# Patient Record
Sex: Female | Born: 1965 | Race: White | Hispanic: No | Smoking: Current some day smoker
Health system: Southern US, Community
[De-identification: ages and names within clinical notes are randomized; demographics above are authoritative.]

## PROBLEM LIST (undated history)

## (undated) DIAGNOSIS — R112 Nausea with vomiting, unspecified: Secondary | ICD-10-CM

## (undated) DIAGNOSIS — I10 Essential (primary) hypertension: Secondary | ICD-10-CM

## (undated) DIAGNOSIS — F419 Anxiety disorder, unspecified: Secondary | ICD-10-CM

## (undated) DIAGNOSIS — Z98891 History of uterine scar from previous surgery: Secondary | ICD-10-CM

## (undated) DIAGNOSIS — T4145XA Adverse effect of unspecified anesthetic, initial encounter: Secondary | ICD-10-CM

## (undated) DIAGNOSIS — M1990 Unspecified osteoarthritis, unspecified site: Secondary | ICD-10-CM

## (undated) DIAGNOSIS — Z9889 Other specified postprocedural states: Secondary | ICD-10-CM

## (undated) DIAGNOSIS — T8859XA Other complications of anesthesia, initial encounter: Secondary | ICD-10-CM

## (undated) HISTORY — PX: ABDOMINAL HYSTERECTOMY: SHX81

## (undated) HISTORY — PX: BREAST EXCISIONAL BIOPSY: SUR124

## (undated) HISTORY — DX: Essential (primary) hypertension: I10

## (undated) HISTORY — PX: TUBAL LIGATION: SHX77

## (undated) HISTORY — PX: CHOLECYSTECTOMY: SHX55

---

## 1998-01-07 ENCOUNTER — Other Ambulatory Visit: Admission: RE | Admit: 1998-01-07 | Discharge: 1998-01-07 | Payer: Self-pay | Admitting: *Deleted

## 2004-05-10 ENCOUNTER — Ambulatory Visit: Payer: Self-pay | Admitting: Orthopedic Surgery

## 2005-07-26 ENCOUNTER — Emergency Department: Payer: Self-pay | Admitting: Emergency Medicine

## 2005-08-03 ENCOUNTER — Ambulatory Visit: Payer: Self-pay | Admitting: Family Medicine

## 2005-08-15 ENCOUNTER — Encounter: Payer: Self-pay | Admitting: Family Medicine

## 2005-09-11 ENCOUNTER — Encounter: Payer: Self-pay | Admitting: Family Medicine

## 2007-03-13 ENCOUNTER — Ambulatory Visit: Payer: Self-pay | Admitting: Family Medicine

## 2009-08-11 ENCOUNTER — Ambulatory Visit: Payer: Self-pay | Admitting: Family Medicine

## 2009-08-14 ENCOUNTER — Ambulatory Visit: Payer: Self-pay | Admitting: Family Medicine

## 2011-09-22 ENCOUNTER — Ambulatory Visit: Payer: Self-pay | Admitting: Family Medicine

## 2011-11-16 ENCOUNTER — Ambulatory Visit: Payer: Self-pay | Admitting: Obstetrics & Gynecology

## 2011-11-16 LAB — CBC
HCT: 39.9 % (ref 35.0–47.0)
HGB: 13.4 g/dL (ref 12.0–16.0)
MCHC: 33.6 g/dL (ref 32.0–36.0)
RDW: 14.9 % — ABNORMAL HIGH (ref 11.5–14.5)
WBC: 8 10*3/uL (ref 3.6–11.0)

## 2011-11-22 ENCOUNTER — Ambulatory Visit: Payer: Self-pay | Admitting: Obstetrics & Gynecology

## 2011-11-22 LAB — PREGNANCY, URINE: Pregnancy Test, Urine: NEGATIVE m[IU]/mL

## 2011-11-23 LAB — PATHOLOGY REPORT

## 2011-11-23 LAB — HEMOGLOBIN: HGB: 11.2 g/dL — ABNORMAL LOW (ref 12.0–16.0)

## 2011-11-30 ENCOUNTER — Ambulatory Visit: Payer: Self-pay | Admitting: Obstetrics & Gynecology

## 2012-10-29 ENCOUNTER — Ambulatory Visit: Payer: Self-pay | Admitting: Surgery

## 2012-10-29 LAB — CBC WITH DIFFERENTIAL/PLATELET
Basophil #: 0 10*3/uL (ref 0.0–0.1)
Basophil %: 0.5 %
Eosinophil #: 0.1 10*3/uL (ref 0.0–0.7)
HCT: 37.9 % (ref 35.0–47.0)
HGB: 13.2 g/dL (ref 12.0–16.0)
Lymphocyte #: 3.2 10*3/uL (ref 1.0–3.6)
MCH: 32.2 pg (ref 26.0–34.0)
Monocyte #: 0.4 x10 3/mm (ref 0.2–0.9)
Monocyte %: 4.3 %
Neutrophil #: 4.7 10*3/uL (ref 1.4–6.5)
RDW: 13.5 % (ref 11.5–14.5)
WBC: 8.4 10*3/uL (ref 3.6–11.0)

## 2012-10-29 LAB — COMPREHENSIVE METABOLIC PANEL
Albumin: 3.8 g/dL (ref 3.4–5.0)
Alkaline Phosphatase: 64 U/L (ref 50–136)
Anion Gap: 5 — ABNORMAL LOW (ref 7–16)
Chloride: 107 mmol/L (ref 98–107)
EGFR (African American): >60
Glucose: 85 mg/dL (ref 65–99)
Osmolality: 275 (ref 275–301)
Potassium: 3.5 mmol/L (ref 3.5–5.1)
SGOT(AST): 18 U/L (ref 15–37)
SGPT (ALT): 16 U/L (ref 12–78)
Total Protein: 6.9 g/dL (ref 6.4–8.2)

## 2012-10-30 ENCOUNTER — Ambulatory Visit: Payer: Self-pay | Admitting: Surgery

## 2012-10-31 LAB — PATHOLOGY REPORT

## 2014-05-23 ENCOUNTER — Ambulatory Visit: Payer: Self-pay | Admitting: Surgery

## 2014-07-01 NOTE — Op Note (Signed)
PATIENT NAME:  Taylor Morris, Taylor Morris MR#:  [id number] DATE OF BIRTH:  10-02-1965  DATE OF PROCEDURE:  11/22/2011  PREOPERATIVE DIAGNOSIS: Menorrhagia, pelvic pain, and dyspareunia.   POSTOPERATIVE DIAGNOSIS: Menorrhagia, pelvic pain, and dyspareunia.   PROCEDURE: Laparoscopic supracervical hysterectomy with lysis of adhesions.   SURGEON:  Barnett Applebaum, M.D.   ASSISTANT: Malachy Mood, M.D. M.D.   ANESTHESIA: General.   ESTIMATED BLOOD LOSS: 25 mL.   COMPLICATIONS: None.   FINDINGS: Significant amount of adhesions with omentum to uterus, left adnexa, and anterior abdominal wall. Normal ovaries.   DISPOSITION: To the recovery room in stable condition.   TECHNIQUE: The patient is prepped and draped in the usual sterile fashion after adequate anesthesia is obtained in the dorsal lithotomy position. A Foley catheter is inserted. A sponge stick is placed per vagina for manipulation purposes.   Attention is then turned to the abdomen where a Veress needle is inserted through a 5-mm infraumbilical incision after Marcaine is used to anesthetize the skin. Veress needle placement is confirmed using the hanging drop technique and the abdomen is then insufflated with CO2 gas. A 5-mm trocar is then inserted under direct visualization with the laparoscope with no injuries or bleeding noted. The patient is placed in Trendelenburg positioning. The above-mentioned findings are visualized.   A 5-mm trocar is placed in the left lower quadrant and an 11-mm trocar is placed in the right lower quadrant lateral to the inferior epigastric blood vessels with no injuries or bleeding noted. There are no adhesions in this location. A 5-mm Harmonic scalpel is then used to perform lysis of adhesions of omentum and scar tissue to the anterior abdominal wall down to the level of the omentum, uterus, and left adnexa. Once this careful dissection is free, then the utero-ovarian blood vessels and ligaments are carefully  coagulated and cut using the Harmonic scalpel with preservation of the ovaries and their main blood supply. The round ligaments are carefully coagulated and cut. The dissection is then carried down to the level of the uterine arteries, which are carefully coagulated using the bipolar cautery device and then dissected free. The uterus and bladder are identified and the bladder is inferiorly retracted away from the cervix. The uterus is then amputated with approximately 1 cm of cervix left behind. The endocervical canal is cauterized. The pelvic cavity is irrigated with aspiration of fluid.   The morcellator device is placed through the right lower quadrant and the uterus is removed by morcellation process. Repeat irrigation and aspiration of fluid is performed. Excellent hemostasis is noted. Interceed is placed over the cervical stump area. The ovaries appear healthy with good blood flow. There is no apparent injury to ureter or bowel structures. A cone device is placed through the right lower quadrant incision and used to assist with the fascial closure using a 1-0 Vicryl suture. Gas is expelled. Trocars are removed. The patient is leveled. Skin is closed with Dermabond. The patient goes to the recovery room in stable condition. All sponge, instrument, and needle counts are correct at the conclusion of the case.    ____________________________ R. Barnett Applebaum, MD [id number]:bjt D: 11/22/2011 10:52:27 ET T: 11/22/2011 11:20:55 ET JOB#: [id number]  cc: Glean Salen, MD, <Dictator> Gae Dry MD ELECTRONICALLY SIGNED 12/01/2011 17:58

## 2014-07-04 NOTE — Op Note (Signed)
PATIENT NAME:  Taylor Morris, Taylor Morris MR#:  [id number] DATE OF BIRTH:  08/23/1965  DATE OF PROCEDURE:  10/30/2012  PREOPERATIVE DIAGNOSIS: Symptomatic cholelithiasis with biliary colic.   POSTOPERATIVE DIAGNOSIS: Symptomatic cholelithiasis with biliary colic.     PROCEDURE PERFORMED: Laparoscopic cholecystectomy.   SURGEON: Hortencia Conradi M.D.   ASSISTANT: None.   ANESTHESIA: General endotracheal.   FINDINGS: Large stones.   SPECIMENS: Gallbladder with contents.   ESTIMATED BLOOD LOSS: Minimal.   DRAINS: None.   COUNT: Lap and needle count correct x2.   DESCRIPTION OF PROCEDURE: With informed consent, supine position, general oral endotracheal anesthesia, left arm was padded and tucked at her side. Abdomen was widely prepped and draped with ChloraPrep solution. Timeout was observed.   A 12 mm blunt Hasson trocar was placed through an open technique through an infraumbilical transversely oriented skin incision with stay sutures being passed through the fascia. Pneumoperitoneum was established. The patient was then positioned in reverse Trendelenburg and airplane right side up.   A 5 mm bladeless trocar was placed in the epigastric region. Then two 5 mm first assistant ports in the right subcostal margin. The gallbladder was grasped along its fundus and elevated towards the right shoulder. The lateral traction was placed on Hartmann pouch. The hepatoduodenal ligament was then incised with blunt technique and a cystic artery and cystic duct were identified with a critical view of safety cholecystectomy being performed.   Intervening lymphatic was also divided between hemoclips. Three clips were placed on the portal side of the cystic duct, one on the gallbladder side and the structure was then divided. The cystic artery was likewise divided between two hemoclips on the portal side, a single clip on the gallbladder side, and sharp scissors. The gallbladder was then retrieved off gallbladder fossa  utilizing hook cautery apparatus, placed into an Endo Catch device, and retrieved. In the process of retrieving, 5 mm angled scope was placed in the epigastric region demonstrating no evidence of bowel injury upon umbilical trocar site insertion.   Hemostasis was ensured on the operative site utilizing point cautery and the application of Surgicel. The right upper quadrant was irrigated with 0.5 liter of normal saline and aspirated dry and hemostasis was ensured on the operative field prior to report removal.   Ports were then removed under direct visualization, the infraumbilical fascial defect being reapproximated with multiple interrupted simple and vertical mattress #0 Vicryl sutures in vertical orientation, the existing stay sutures being tied to each other. A total of 30 mL of 0.25% plain Marcaine was infiltrated along all skin and fascial incisions prior to closure. The right upper quadrant port site was closed utilizing interrupted 4-0 nylon sutures due to subcutaneous bleeding. The remaining skin incisions were reapproximated utilizing 4-0 Vicryl subcuticular followed by benzoin, Steri-Strips, Telfa, and Tegaderm. The patient was then subsequently extubated and taken to the recovery room in stable and satisfactory condition by anesthesia services.     ____________________________ Jeannette How Marina Gravel, MD mab:np D: 10/30/2012 13:40:51 ET T: 10/30/2012 15:22:16 ET JOB#: [id number]  cc: Elta Guadeloupe A. Marina Gravel, MD, <Dictator> Hortencia Conradi MD ELECTRONICALLY SIGNED 11/01/2012 13:28

## 2014-07-13 HISTORY — PX: INCISION AND DRAINAGE BREAST ABSCESS: SUR672

## 2014-07-13 HISTORY — PX: BREAST BIOPSY: SHX20

## 2014-07-16 ENCOUNTER — Encounter: Payer: Self-pay | Admitting: *Deleted

## 2014-07-16 ENCOUNTER — Inpatient Hospital Stay: Admission: RE | Admit: 2014-07-16 | Payer: Self-pay | Source: Ambulatory Visit

## 2014-07-16 DIAGNOSIS — Z90710 Acquired absence of both cervix and uterus: Secondary | ICD-10-CM | POA: Diagnosis not present

## 2014-07-16 DIAGNOSIS — F17200 Nicotine dependence, unspecified, uncomplicated: Secondary | ICD-10-CM | POA: Diagnosis not present

## 2014-07-16 DIAGNOSIS — Z79899 Other long term (current) drug therapy: Secondary | ICD-10-CM | POA: Diagnosis not present

## 2014-07-16 DIAGNOSIS — Z8043 Family history of malignant neoplasm of testis: Secondary | ICD-10-CM | POA: Diagnosis not present

## 2014-07-16 DIAGNOSIS — N6091 Unspecified benign mammary dysplasia of right breast: Secondary | ICD-10-CM | POA: Diagnosis not present

## 2014-07-16 DIAGNOSIS — K219 Gastro-esophageal reflux disease without esophagitis: Secondary | ICD-10-CM | POA: Diagnosis not present

## 2014-07-16 DIAGNOSIS — Z8249 Family history of ischemic heart disease and other diseases of the circulatory system: Secondary | ICD-10-CM | POA: Diagnosis not present

## 2014-07-16 DIAGNOSIS — Z833 Family history of diabetes mellitus: Secondary | ICD-10-CM | POA: Diagnosis not present

## 2014-07-16 DIAGNOSIS — N61 Inflammatory disorders of breast: Secondary | ICD-10-CM | POA: Diagnosis not present

## 2014-07-22 ENCOUNTER — Encounter
Admission: RE | Disposition: A | Discharge status: Home / Self Care | Payer: Self-pay | Source: Ambulatory Visit | Attending: Surgery

## 2014-07-22 ENCOUNTER — Encounter: Payer: Self-pay | Admitting: *Deleted

## 2014-07-22 ENCOUNTER — Ambulatory Visit: Payer: No Typology Code available for payment source | Admitting: Anesthesiology

## 2014-07-22 ENCOUNTER — Ambulatory Visit
Admission: RE | Admit: 2014-07-22 | Discharge: 2014-07-22 | Disposition: A | Discharge status: Home / Self Care | Payer: No Typology Code available for payment source | Source: Ambulatory Visit | Attending: Surgery | Admitting: Surgery

## 2014-07-22 DIAGNOSIS — Z833 Family history of diabetes mellitus: Secondary | ICD-10-CM | POA: Insufficient documentation

## 2014-07-22 DIAGNOSIS — F17200 Nicotine dependence, unspecified, uncomplicated: Secondary | ICD-10-CM | POA: Insufficient documentation

## 2014-07-22 DIAGNOSIS — N61 Inflammatory disorders of breast: Secondary | ICD-10-CM | POA: Diagnosis not present

## 2014-07-22 DIAGNOSIS — Z79899 Other long term (current) drug therapy: Secondary | ICD-10-CM | POA: Insufficient documentation

## 2014-07-22 DIAGNOSIS — Z8249 Family history of ischemic heart disease and other diseases of the circulatory system: Secondary | ICD-10-CM | POA: Insufficient documentation

## 2014-07-22 DIAGNOSIS — Z90710 Acquired absence of both cervix and uterus: Secondary | ICD-10-CM | POA: Insufficient documentation

## 2014-07-22 DIAGNOSIS — N6091 Unspecified benign mammary dysplasia of right breast: Secondary | ICD-10-CM | POA: Insufficient documentation

## 2014-07-22 DIAGNOSIS — K219 Gastro-esophageal reflux disease without esophagitis: Secondary | ICD-10-CM | POA: Insufficient documentation

## 2014-07-22 DIAGNOSIS — Z8043 Family history of malignant neoplasm of testis: Secondary | ICD-10-CM | POA: Insufficient documentation

## 2014-07-22 HISTORY — DX: History of uterine scar from previous surgery: Z98.891

## 2014-07-22 HISTORY — DX: Unspecified osteoarthritis, unspecified site: M19.90

## 2014-07-22 HISTORY — DX: Adverse effect of unspecified anesthetic, initial encounter: T41.45XA

## 2014-07-22 HISTORY — PX: BREAST DUCTAL SYSTEM EXCISION: SHX5242

## 2014-07-22 HISTORY — DX: Other specified postprocedural states: Z98.890

## 2014-07-22 HISTORY — DX: Other complications of anesthesia, initial encounter: T88.59XA

## 2014-07-22 HISTORY — DX: Nausea with vomiting, unspecified: R11.2

## 2014-07-22 SURGERY — EXCISION DUCTAL SYSTEM BREAST
Anesthesia: General | Laterality: Right | Wound class: Clean

## 2014-07-22 MED ORDER — MIDAZOLAM HCL 2 MG/2ML IJ SOLN
INTRAMUSCULAR | Status: DC | PRN
  Administered 2014-07-22: 2 mg via INTRAVENOUS

## 2014-07-22 MED ORDER — HYDROCODONE-ACETAMINOPHEN 5-325 MG PO TABS
1.0000 | ORAL_TABLET | Freq: Once | ORAL | Status: AC
  Administered 2014-07-22: 1 via ORAL

## 2014-07-22 MED ORDER — LIDOCAINE HCL (CARDIAC) 20 MG/ML IV SOLN
INTRAVENOUS | Status: DC | PRN
  Administered 2014-07-22: 1 mg via INTRAVENOUS
  Administered 2014-07-22: 80 mg via INTRAVENOUS

## 2014-07-22 MED ORDER — HYDROCODONE-ACETAMINOPHEN 5-325 MG PO TABS: 1.0000 | ORAL_TABLET | Freq: Four times a day (QID) | ORAL | Status: DC | PRN

## 2014-07-22 MED ORDER — PROPOFOL 10 MG/ML IV BOLUS
INTRAVENOUS | Status: DC | PRN
  Administered 2014-07-22: 150 mg via INTRAVENOUS

## 2014-07-22 MED ORDER — BUPIVACAINE HCL 0.25 % IJ SOLN
INTRAMUSCULAR | Status: DC | PRN
  Administered 2014-07-22: 20 mL

## 2014-07-22 MED ORDER — EPHEDRINE SULFATE 50 MG/ML IJ SOLN
INTRAMUSCULAR | Status: DC | PRN
  Administered 2014-07-22: 5 mg via INTRAVENOUS

## 2014-07-22 MED ORDER — HYDROCODONE-ACETAMINOPHEN 5-325 MG PO TABS
ORAL_TABLET | ORAL | Status: AC
  Filled 2014-07-22: qty 1

## 2014-07-22 MED ORDER — BUPIVACAINE HCL (PF) 0.25 % IJ SOLN
INTRAMUSCULAR | Status: AC
  Filled 2014-07-22: qty 30

## 2014-07-22 MED ORDER — CEFAZOLIN SODIUM-DEXTROSE 2-3 GM-% IV SOLR
INTRAVENOUS | Status: AC
  Filled 2014-07-22: qty 50

## 2014-07-22 MED ORDER — ONDANSETRON HCL 4 MG/2ML IJ SOLN: 4.0000 mg | Freq: Once | INTRAMUSCULAR | Status: DC | PRN

## 2014-07-22 MED ORDER — FAMOTIDINE 20 MG PO TABS
20.0000 mg | ORAL_TABLET | Freq: Once | ORAL | Status: AC
  Administered 2014-07-22: 20 mg via ORAL

## 2014-07-22 MED ORDER — HYDROMORPHONE HCL 1 MG/ML IJ SOLN: 0.2500 mg | INTRAMUSCULAR | Status: DC | PRN

## 2014-07-22 MED ORDER — CEFAZOLIN SODIUM-DEXTROSE 2-3 GM-% IV SOLR
2.0000 g | Freq: Once | INTRAVENOUS | Status: AC
  Administered 2014-07-22: 13:00:00 via INTRAVENOUS

## 2014-07-22 MED ORDER — DEXAMETHASONE SODIUM PHOSPHATE 4 MG/ML IJ SOLN
INTRAMUSCULAR | Status: DC | PRN
  Administered 2014-07-22: 5 mg via INTRAVENOUS

## 2014-07-22 MED ORDER — LACTATED RINGERS IV SOLN
INTRAVENOUS | Status: DC
  Administered 2014-07-22 (×2): via INTRAVENOUS

## 2014-07-22 MED ORDER — FENTANYL CITRATE (PF) 100 MCG/2ML IJ SOLN
INTRAMUSCULAR | Status: DC | PRN
  Administered 2014-07-22 (×2): 50 ug via INTRAVENOUS

## 2014-07-22 MED ORDER — FAMOTIDINE 20 MG PO TABS
ORAL_TABLET | ORAL | Status: AC
  Filled 2014-07-22: qty 1

## 2014-07-22 MED ORDER — ONDANSETRON HCL 4 MG/2ML IJ SOLN
INTRAMUSCULAR | Status: DC | PRN
  Administered 2014-07-22: 4 mg via INTRAVENOUS

## 2014-07-22 SURGICAL SUPPLY — 42 items
BLADE SURG 15 STRL LF DISP TIS (BLADE) ×1 IMPLANT
BLADE SURG 15 STRL SS (BLADE) ×2
CANISTER SUCT 1200ML W/VALVE (MISCELLANEOUS) ×3 IMPLANT
CHLORAPREP W/TINT 26ML (MISCELLANEOUS) ×3 IMPLANT
CLOSURE WOUND 1/2 X4 (GAUZE/BANDAGES/DRESSINGS)
CNTNR SPEC 2.5X3XGRAD LEK (MISCELLANEOUS)
CONT SPEC 4OZ STER OR WHT (MISCELLANEOUS)
CONTAINER SPEC 2.5X3XGRAD LEK (MISCELLANEOUS) IMPLANT
DRAPE LAPAROTOMY TRNSV 106X77 (MISCELLANEOUS) ×3 IMPLANT
DRAPE SHEET LG 3/4 BI-LAMINATE (DRAPES) ×3 IMPLANT
DRAPE UTILITY 15X26 TOWEL STRL (DRAPES) ×6 IMPLANT
DRESSING TELFA 4X3 1S ST N-ADH (GAUZE/BANDAGES/DRESSINGS) ×3 IMPLANT
DRSG TEGADERM 2-3/8X2-3/4 SM (GAUZE/BANDAGES/DRESSINGS) IMPLANT
DRSG TEGADERM 4X4.75 (GAUZE/BANDAGES/DRESSINGS) ×3 IMPLANT
ELECT CAUTERY BLADE TIP 2.5 (TIP) ×3
ELECTRODE CAUTERY BLDE TIP 2.5 (TIP) ×1 IMPLANT
GLOVE BIO SURGEON STRL SZ7.5 (GLOVE) ×9 IMPLANT
GOWN STRL REUS W/ TWL LRG LVL3 (GOWN DISPOSABLE) ×2 IMPLANT
GOWN STRL REUS W/ TWL XL LVL3 (GOWN DISPOSABLE) ×1 IMPLANT
GOWN STRL REUS W/TWL LRG LVL3 (GOWN DISPOSABLE) ×4
GOWN STRL REUS W/TWL XL LVL3 (GOWN DISPOSABLE) ×2
KIT RM TURNOVER STRD PROC AR (KITS) ×3 IMPLANT
LABEL OR SOLS (LABEL) IMPLANT
MARGIN MAP 10MM (MISCELLANEOUS) IMPLANT
NDL SAFETY 22GX1.5 (NEEDLE) ×3 IMPLANT
PACK BASIN MINOR ARMC (MISCELLANEOUS) ×3 IMPLANT
PAD GROUND ADULT SPLIT (MISCELLANEOUS) ×3 IMPLANT
STRIP CLOSURE SKIN 1/2X4 (GAUZE/BANDAGES/DRESSINGS) IMPLANT
STRIP SUTURE WOUND CLOSURE 1/2 (SUTURE) IMPLANT
SURGI-BRA LG (MISCELLANEOUS) IMPLANT
SUT ETHILON 3-0 FS-10 30 BLK (SUTURE)
SUT MNCRL 4-0 (SUTURE)
SUT MNCRL 4-0 27XMFL (SUTURE)
SUT MON AB 5-0 P3 18 (SUTURE) IMPLANT
SUT VIC AB 3-0 SH 27 (SUTURE) ×4
SUT VIC AB 3-0 SH 27X BRD (SUTURE) ×2 IMPLANT
SUT VIC AB 4-0 FS2 27 (SUTURE) ×3 IMPLANT
SUTURE EHLN 3-0 FS-10 30 BLK (SUTURE) IMPLANT
SUTURE MNCRL 4-0 27XMF (SUTURE) IMPLANT
SYR CONTROL 10ML (SYRINGE) ×3 IMPLANT
SYRINGE 10CC LL (SYRINGE) ×3 IMPLANT
WATER STERILE IRR 1000ML POUR (IV SOLUTION) ×3 IMPLANT

## 2014-07-22 NOTE — Anesthesia Procedure Notes (Signed)
Procedure Name: LMA Insertion Date/Time: 07/22/2014 1:00 PM Performed by: Silvana Newness Pre-anesthesia Checklist: Patient identified, Emergency Drugs available, Suction available, Patient being monitored and Timeout performed Patient Re-evaluated:Patient Re-evaluated prior to inductionOxygen Delivery Method: Circle system utilized Preoxygenation: Pre-oxygenation with 100% oxygen Intubation Type: IV induction Ventilation: Mask ventilation without difficulty LMA: LMA inserted LMA Size: 3.5 Number of attempts: 1 Placement Confirmation: breath sounds checked- equal and bilateral and positive ETCO2 Tube secured with: Tape

## 2014-07-22 NOTE — Anesthesia Postprocedure Evaluation (Signed)
  Anesthesia Post-op Note  Patient: Taylor Morris  Procedure(s) Performed: Procedure(s): right breast major duct excision  (Right)  Anesthesia type:General  Patient location: PACU  Post pain: Pain level controlled  Post assessment: Post-op Vital signs reviewed, Patient's Cardiovascular Status Stable, Respiratory Function Stable, Patent Airway and No signs of Nausea or vomiting  Post vital signs: Reviewed and stable  Last Vitals:  Filed Vitals:   07/22/14 1357  BP: 102/62  Pulse: 54  Temp:   Resp: 13    Level of consciousness: awake, alert  and patient cooperative  Complications: No apparent anesthesia complications

## 2014-07-22 NOTE — Op Note (Signed)
07/22/2014  1:38 PM  PATIENT:  Taylor Morris  49 y.o. female  PRE-OPERATIVE DIAGNOSIS:  right periductal mastitis  POST-OPERATIVE DIAGNOSIS:  right periductal mastitis   PROCEDURE:  Procedure(s): right breast major duct excision  (Right)  SURGEON:  Surgeon(s) and Role:    * Sherri Rad, MD - Primary  Anesthesia General  SPECIMEN:  As above.  DICTATION:  With informed consent supine position and general anesthesia the right breast was sterilely prepped and draped corporative solution. Timeout was observed.  A curvilinear incision encompassing the area of prior mastitis in the right breast located between the 12:00 and 3:00 position at the nipple areolar complex was incised. Prior scar was excised and submitted as specimen. Major duct excision was performed in a wedge-shaped fashion utilizing sharp dissection. The specimen was submitted to pathology in formalin. The undersurface of the dermis around the nipple was cleaned of tissue. Hemostasis was obtained with point cautery. A total of 20 cc of 0.5 Plain Marcaine was Infiltrated.  The wound was then closed the deep layer 3-0 Vicryls 4-0 Vicryl subcutaneous and skin. Steri-Strips Telfa and Tegaderm were applied and the patient subsequently activated to the recovery room in stable and satisfactory condition by anesthesia services

## 2014-07-22 NOTE — Transfer of Care (Signed)
Immediate Anesthesia Transfer of Care Note  Patient: Taylor Morris  Procedure(s) Performed: Procedure(s): right breast major duct excision  (Right)  Patient Location: PACU  Anesthesia Type:General  Level of Consciousness: awake, alert , oriented and patient cooperative  Airway & Oxygen Therapy: Patient Spontanous Breathing and Patient connected to face mask oxygen  Post-op Assessment: Report given to RN, Post -op Vital signs reviewed and stable and Patient moving all extremities X 4  Post vital signs: Reviewed and stable  Last Vitals:  Filed Vitals:   07/22/14 1342  BP: 97/66  Pulse: 51  Temp: 36.1 C  Resp: 13    Complications: No apparent anesthesia complications

## 2014-07-22 NOTE — Discharge Instructions (Addendum)
DISCHARGE INSTRUCTIONS TO PATIENT  REMINDER:   Carry a list of your medications and allergies with you at all times  Call your pharmacy at least 1 week in advance to refill prescriptions  Do not mix any prescribed pain medicine with alcohol  Do not drive any motor vehicles while taking pain medication.  Take medications with food.  Do not retake a pain medication if you vomit after taking it.  Activity: no lifting more than 15 pounds until instructed by your doctor.   Dressing Care Instruction (if applicable):   May Shower-  Call office if any questions regarding this activity.  Dry Dressing as needed to operative site.   Follow-up appointments (date to return to physician): Call for appointment with Dr. Sherri Rad, MD at [phone] or [phone]  If need MD on call after hours and on weekends call Hospital operator at [phone] as ask to speak to Surgeon on call for [facility].  Call Surgeon if you have:  Temperature greater than 100.4  Persistent nausea and vomiting  Severe uncontrolled pain  Redness, tenderness, or signs of infection (pain, swelling, redness, odor or green/yellow discharge around the site)  Difficulty breathing, headache or visual disturbances  Hives  Persistent dizziness or light-headedness  Extreme fatigue  Any other questions or concerns you may have after discharge  In an emergency, call 911 or go to an Emergency Department at a nearby hospital  Diet:  Resume your usual diet.  Avoid spicy, greasy or heavy foods.  If you have nausea or vomiting, go back to liquids.  If you cannot keep liquids down, call your doctor.  Avoid alcohol consumption while on prescription pain medications. Good nutrition promotes healing. Increase fiber and fluids.     I understand and acknowledge receipt of the above instructions.                                                                                                      Patient or Guardian Signature                                                                    Date/Time  Physician's or R.N.'s Signature                                                                  Date/Time  The discharge instructions have been reviewed with the patient and/or Family Member/Parent/Guardian.  Patient and/or Family Member/Parent/Guardian signed and retained a printed copy. General Anesthesia, Care After Refer to this sheet in the next few weeks. These instructions provide you with information on caring for yourself after your procedure. Your health care provider may also give you more specific instructions. Your treatment has been planned according to current medical practices, but problems sometimes occur. Call your health care provider if you have any problems or questions after your procedure. WHAT TO EXPECT AFTER THE PROCEDURE After the procedure, it is typical to experience:  Sleepiness.  Nausea and vomiting. HOME CARE INSTRUCTIONS  For the first 24 hours after general anesthesia:  Have a responsible person with you.  Do not drive a car. If you are alone, do not take public transportation.  Do not drink alcohol.  Do not take medicine that has not been prescribed by your health care provider.  Do not sign important papers or make important decisions.  You may resume a normal diet and activities as directed by your health care provider.  Change bandages (dressings) as directed.  If you have questions or problems that seem related to general anesthesia, call the hospital and ask for the anesthetist or anesthesiologist on call. SEEK MEDICAL CARE IF:  You have nausea and vomiting that continue the day after anesthesia.  You develop a rash. SEEK IMMEDIATE MEDICAL CARE IF:   You have difficulty  breathing.  You have chest pain.  You have any allergic problems. Document Released: 06/06/2000 Document Revised: 03/05/2013 Document Reviewed: 09/13/2012 [facility] Patient Information 2015 [place], [place]. This information is not intended to replace advice given to you by your health care provider. Make sure you discuss any questions you have with your health care provider.

## 2014-07-22 NOTE — H&P (Signed)
Date of Initial H&P: 07/10/2014  History reviewed, patient examined, no change in status, stable for surgery.

## 2014-07-22 NOTE — Anesthesia Preprocedure Evaluation (Signed)
Anesthesia Evaluation  Patient identified by MRN, date of birth, ID band Patient awake    Reviewed: Allergy & Precautions, NPO status , Patient's Chart, lab work & pertinent test results  History of Anesthesia Complications (+) PONV and history of anesthetic complications  Airway Mallampati: II  TM Distance: >3 FB Neck ROM: Full    Dental  (+) Partial Upper   Pulmonary Current Smoker,  breath sounds clear to auscultation  Pulmonary exam normal       Cardiovascular Exercise Tolerance: Good negative cardio ROS Normal cardiovascular examRhythm:Regular Rate:Normal     Neuro/Psych negative neurological ROS  negative psych ROS   GI/Hepatic negative GI ROS, Neg liver ROS,   Endo/Other  negative endocrine ROS  Renal/GU negative Renal ROS  negative genitourinary   Musculoskeletal  (+) Arthritis -, Osteoarthritis,    Abdominal   Peds negative pediatric ROS (+)  Hematology negative hematology ROS (+)   Anesthesia Other Findings   Reproductive/Obstetrics negative OB ROS                             Anesthesia Physical Anesthesia Plan  ASA: II  Anesthesia Plan: General   Post-op Pain Management:    Induction: Intravenous  Airway Management Planned: LMA  Additional Equipment:   Intra-op Plan:   Post-operative Plan: Extubation in OR  Informed Consent: I have reviewed the patients History and Physical, chart, labs and discussed the procedure including the risks, benefits and alternatives for the proposed anesthesia with the patient or authorized representative who has indicated his/her understanding and acceptance.   Dental advisory given  Plan Discussed with: CRNA and Surgeon  Anesthesia Plan Comments:         Anesthesia Quick Evaluation

## 2014-07-24 LAB — SURGICAL PATHOLOGY

## 2014-07-27 ENCOUNTER — Encounter: Payer: Self-pay | Admitting: Surgery

## 2014-08-14 ENCOUNTER — Ambulatory Visit: Payer: Self-pay | Admitting: Surgery

## 2014-08-14 ENCOUNTER — Telehealth: Payer: Self-pay | Admitting: Surgery

## 2014-08-14 NOTE — Telephone Encounter (Signed)
Prov cx. Left message for pt to call and reschedule.

## 2015-06-23 ENCOUNTER — Other Ambulatory Visit: Payer: Self-pay | Admitting: Nurse Practitioner

## 2015-06-23 DIAGNOSIS — Z1231 Encounter for screening mammogram for malignant neoplasm of breast: Secondary | ICD-10-CM

## 2015-06-25 ENCOUNTER — Ambulatory Visit
Admission: RE | Admit: 2015-06-25 | Discharge: 2015-06-25 | Disposition: A | Discharge status: Home / Self Care | Payer: BLUE CROSS/BLUE SHIELD | Source: Ambulatory Visit | Attending: Nurse Practitioner | Admitting: Nurse Practitioner

## 2015-06-25 ENCOUNTER — Other Ambulatory Visit: Payer: Self-pay | Admitting: Nurse Practitioner

## 2015-06-25 DIAGNOSIS — M545 Low back pain: Secondary | ICD-10-CM | POA: Insufficient documentation

## 2015-06-25 DIAGNOSIS — M542 Cervicalgia: Secondary | ICD-10-CM | POA: Insufficient documentation

## 2015-06-25 DIAGNOSIS — M5136 Other intervertebral disc degeneration, lumbar region: Secondary | ICD-10-CM | POA: Insufficient documentation

## 2015-07-02 ENCOUNTER — Ambulatory Visit
Admission: RE | Admit: 2015-07-02 | Discharge: 2015-07-02 | Disposition: A | Discharge status: Home / Self Care | Payer: BLUE CROSS/BLUE SHIELD | Source: Ambulatory Visit | Attending: Nurse Practitioner | Admitting: Nurse Practitioner

## 2015-07-02 DIAGNOSIS — Z1231 Encounter for screening mammogram for malignant neoplasm of breast: Secondary | ICD-10-CM

## 2015-07-03 ENCOUNTER — Other Ambulatory Visit: Payer: Self-pay | Admitting: Nurse Practitioner

## 2015-07-03 DIAGNOSIS — N644 Mastodynia: Secondary | ICD-10-CM

## 2015-07-03 DIAGNOSIS — N6452 Nipple discharge: Secondary | ICD-10-CM

## 2015-07-17 ENCOUNTER — Other Ambulatory Visit: Payer: No Typology Code available for payment source

## 2015-07-17 ENCOUNTER — Ambulatory Visit: Payer: No Typology Code available for payment source

## 2015-07-20 ENCOUNTER — Ambulatory Visit
Admission: RE | Admit: 2015-07-20 | Discharge: 2015-07-20 | Disposition: A | Discharge status: Home / Self Care | Payer: BLUE CROSS/BLUE SHIELD | Source: Ambulatory Visit | Attending: Nurse Practitioner | Admitting: Nurse Practitioner

## 2015-07-20 DIAGNOSIS — N644 Mastodynia: Secondary | ICD-10-CM | POA: Insufficient documentation

## 2015-07-20 DIAGNOSIS — N6452 Nipple discharge: Secondary | ICD-10-CM

## 2015-07-20 DIAGNOSIS — N6001 Solitary cyst of right breast: Secondary | ICD-10-CM | POA: Diagnosis not present

## 2015-07-20 DIAGNOSIS — N6041 Mammary duct ectasia of right breast: Secondary | ICD-10-CM | POA: Insufficient documentation

## 2015-07-21 ENCOUNTER — Other Ambulatory Visit (HOSPITAL_COMMUNITY): Payer: Self-pay | Admitting: Nurse Practitioner

## 2015-07-21 DIAGNOSIS — M542 Cervicalgia: Secondary | ICD-10-CM

## 2015-08-12 ENCOUNTER — Ambulatory Visit: Payer: BLUE CROSS/BLUE SHIELD

## 2015-10-14 ENCOUNTER — Encounter: Payer: Self-pay | Admitting: *Deleted

## 2015-10-19 ENCOUNTER — Ambulatory Visit: Payer: Self-pay | Admitting: General Surgery

## 2015-10-28 ENCOUNTER — Encounter: Payer: Self-pay | Admitting: General Surgery

## 2015-10-28 ENCOUNTER — Ambulatory Visit (INDEPENDENT_AMBULATORY_CARE_PROVIDER_SITE_OTHER): Payer: BLUE CROSS/BLUE SHIELD | Admitting: General Surgery

## 2015-10-28 VITALS — BP 130/70 | HR 70 | Resp 12 | Ht 62.0 in | Wt 171.0 lb

## 2015-10-28 DIAGNOSIS — N611 Abscess of the breast and nipple: Secondary | ICD-10-CM | POA: Diagnosis not present

## 2015-10-28 NOTE — Progress Notes (Signed)
Patient ID: Taylor Morris, female   DOB: [birth year], 50 y.o.   MRN: [id number]  Chief Complaint  Patient presents with  . Breast Problem    HPI Taylor Morris is a 50 y.o. female.  Here today for breast evaluation. She states she had a right breast abscess drained and a duct excised  last year by Dr Marina Gravel and it just seems to persist. She states when it flares up she has right breast pain, swelling with drainage occurring randomly. Last episode started first part of July.  Mammogram and right breast ultrasound was May 2017. I have reviewed the history of present illness with the patient.   HPI  Past Medical History:  Diagnosis Date  . Arthritis   . Complication of anesthesia   . H/O cesarean section    times 2  . PONV (postoperative nausea and vomiting)     Past Surgical History:  Procedure Laterality Date  . ABDOMINAL HYSTERECTOMY    . BREAST BIOPSY Right 07/2014   surgery to remove abcess 3:00/ Dr Marina Gravel  . BREAST DUCTAL SYSTEM EXCISION Right 07/22/2014   Procedure: right breast major duct excision ;  Surgeon: Sherri Rad, MD;  Location: [facility] ORS;  Service: General;  Laterality: Right;  . CESAREAN SECTION     times 2  . CHOLECYSTECTOMY    . INCISION AND DRAINAGE BREAST ABSCESS Right 05/16  . TUBAL LIGATION      Family History  Problem Relation Age of Onset  . Breast cancer Maternal Aunt 68    Social History Social History  Substance Use Topics  . Smoking status: Current Some Day Smoker    Packs/day: 0.25    Years: 30.00  . Smokeless tobacco: Current User  . Alcohol use No    No Known Allergies  Current Outpatient Prescriptions  Medication Sig Dispense Refill  . clonazePAM (KLONOPIN) 0.5 MG tablet take 1 tablet by mouth if needed FOR ACUTE ANXIETY  0  . sulfamethoxazole-trimethoprim (BACTRIM DS,SEPTRA DS) 800-160 MG tablet take 1 tablet by mouth twice a day for 14 days  0   No current facility-administered medications for this visit.     Review of  Systems Review of Systems  Constitutional: Negative.   Respiratory: Negative.   Cardiovascular: Negative.     Blood pressure 130/70, pulse 70, resp. rate 12, height 5\' 2"  (1.575 m), weight 171 lb (77.6 kg).  Physical Exam Physical Exam  Constitutional: She is oriented to person, place, and time. She appears well-developed and well-nourished.  HENT:  Mouth/Throat: Oropharynx is clear and moist.  Eyes: Conjunctivae are normal. No scleral icterus.  Neck: Neck supple.  Pulmonary/Chest: Effort normal and breath sounds normal. Right breast exhibits skin change and tenderness. Right breast exhibits no inverted nipple, no mass and no nipple discharge. Left breast exhibits no inverted nipple, no mass, no nipple discharge, no skin change and no tenderness.    Lymphadenopathy:    She has no cervical adenopathy.    She has no axillary adenopathy.  Neurological: She is alert and oriented to person, place, and time.  Skin: Skin is warm and dry.  Psychiatric: Her behavior is normal.    Data Reviewed Prior notes, recent mammogram and right breast ultrasound.  Assessment    Recurring abscess in medial right breast. Pt is a smoker and this likely has an adverse effect on her breast infection.    Plan   Await c/s report before initiating appropriate antibiotic. Pt is aware smoking is detrimental to  her ongoing breast infection. Encouraged again to stop smoking.   Follow up appointment to be announced after C/S report and review of prior interventions.  This information has been scribed by Karie Fetch RN, BSN,BC.   Torii Royse G 10/29/2015, 11:27 AM

## 2015-10-28 NOTE — Patient Instructions (Addendum)
The patient is aware to call back for any questions or concerns. Follow up appointment to be announced. 

## 2015-11-02 ENCOUNTER — Emergency Department: Payer: BLUE CROSS/BLUE SHIELD

## 2015-11-02 ENCOUNTER — Emergency Department
Admission: EM | Admit: 2015-11-02 | Discharge: 2015-11-02 | Disposition: A | Discharge status: Home / Self Care | Payer: BLUE CROSS/BLUE SHIELD | Attending: Emergency Medicine | Admitting: Emergency Medicine

## 2015-11-02 DIAGNOSIS — R42 Dizziness and giddiness: Secondary | ICD-10-CM | POA: Diagnosis not present

## 2015-11-02 DIAGNOSIS — Z79899 Other long term (current) drug therapy: Secondary | ICD-10-CM | POA: Insufficient documentation

## 2015-11-02 DIAGNOSIS — R197 Diarrhea, unspecified: Secondary | ICD-10-CM | POA: Diagnosis not present

## 2015-11-02 DIAGNOSIS — F17200 Nicotine dependence, unspecified, uncomplicated: Secondary | ICD-10-CM | POA: Insufficient documentation

## 2015-11-02 DIAGNOSIS — M546 Pain in thoracic spine: Secondary | ICD-10-CM | POA: Insufficient documentation

## 2015-11-02 DIAGNOSIS — M549 Dorsalgia, unspecified: Secondary | ICD-10-CM | POA: Diagnosis present

## 2015-11-02 DIAGNOSIS — R112 Nausea with vomiting, unspecified: Secondary | ICD-10-CM | POA: Insufficient documentation

## 2015-11-02 HISTORY — DX: Anxiety disorder, unspecified: F41.9

## 2015-11-02 LAB — CBC WITH DIFFERENTIAL/PLATELET
BASOS ABS: 0.1 10*3/uL (ref 0–0.1)
Basophils Relative: 1 %
EOS PCT: 1 %
Eosinophils Absolute: 0.1 10*3/uL (ref 0–0.7)
HEMATOCRIT: 46.6 % (ref 35.0–47.0)
Hemoglobin: 16.2 g/dL — ABNORMAL HIGH (ref 12.0–16.0)
LYMPHS ABS: 2.8 10*3/uL (ref 1.0–3.6)
LYMPHS PCT: 35 %
MCH: 32 pg (ref 26.0–34.0)
MCHC: 34.7 g/dL (ref 32.0–36.0)
MCV: 92.2 fL (ref 80.0–100.0)
Monocytes Absolute: 0.3 10*3/uL (ref 0.2–0.9)
Monocytes Relative: 4 %
NEUTROS ABS: 4.8 10*3/uL (ref 1.4–6.5)
Neutrophils Relative %: 59 %
PLATELETS: 214 10*3/uL (ref 150–440)
RBC: 5.05 MIL/uL (ref 3.80–5.20)
RDW: 13.8 % (ref 11.5–14.5)
WBC: 7.9 10*3/uL (ref 3.6–11.0)

## 2015-11-02 LAB — TROPONIN I: Troponin I: <0.03 ng/mL (ref <0.03)

## 2015-11-02 LAB — COMPREHENSIVE METABOLIC PANEL
ALT: 11 U/L — AB (ref 14–54)
AST: 18 U/L (ref 15–41)
Albumin: 4.9 g/dL (ref 3.5–5.0)
Alkaline Phosphatase: 65 U/L (ref 38–126)
Anion gap: 8 (ref 5–15)
BILIRUBIN TOTAL: 0.3 mg/dL (ref 0.3–1.2)
BUN: 8 mg/dL (ref 6–20)
CO2: 25 mmol/L (ref 22–32)
CREATININE: 0.82 mg/dL (ref 0.44–1.00)
Calcium: 9.6 mg/dL (ref 8.9–10.3)
Chloride: 105 mmol/L (ref 101–111)
Glucose, Bld: 100 mg/dL — ABNORMAL HIGH (ref 65–99)
Potassium: 3.3 mmol/L — ABNORMAL LOW (ref 3.5–5.1)
Sodium: 138 mmol/L (ref 135–145)
Total Protein: 7.9 g/dL (ref 6.5–8.1)

## 2015-11-02 LAB — LIPASE, BLOOD: LIPASE: 25 U/L (ref 11–51)

## 2015-11-02 LAB — URINALYSIS COMPLETE WITH MICROSCOPIC (ARMC ONLY)
BILIRUBIN URINE: NEGATIVE
GLUCOSE, UA: NEGATIVE mg/dL
KETONES UR: NEGATIVE mg/dL
Leukocytes, UA: NEGATIVE
Nitrite: NEGATIVE
PROTEIN: NEGATIVE mg/dL
SPECIFIC GRAVITY, URINE: 1.004 — AB (ref 1.005–1.030)
pH: 6 (ref 5.0–8.0)

## 2015-11-02 LAB — ANAEROBIC AND AEROBIC CULTURE

## 2015-11-02 MED ORDER — KETOROLAC TROMETHAMINE 30 MG/ML IJ SOLN
30.0000 mg | Freq: Once | INTRAMUSCULAR | Status: AC
  Administered 2015-11-02: 30 mg via INTRAVENOUS
  Filled 2015-11-02: qty 1

## 2015-11-02 MED ORDER — DIAZEPAM 5 MG PO TABS
5.0000 mg | ORAL_TABLET | Freq: Once | ORAL | Status: AC
  Administered 2015-11-02: 5 mg via ORAL
  Filled 2015-11-02: qty 1

## 2015-11-02 MED ORDER — SODIUM CHLORIDE 0.9 % IV BOLUS (SEPSIS)
1000.0000 mL | Freq: Once | INTRAVENOUS | Status: AC
  Administered 2015-11-02: 1000 mL via INTRAVENOUS

## 2015-11-02 MED ORDER — ONDANSETRON HCL 4 MG/2ML IJ SOLN
4.0000 mg | Freq: Once | INTRAMUSCULAR | Status: AC
  Administered 2015-11-02: 4 mg via INTRAVENOUS
  Filled 2015-11-02: qty 2

## 2015-11-02 MED ORDER — LIDOCAINE 5 % EX PTCH
1.0000 | MEDICATED_PATCH | CUTANEOUS | Status: DC
  Administered 2015-11-02: 1 via TRANSDERMAL
  Filled 2015-11-02: qty 1

## 2015-11-02 MED ORDER — MECLIZINE HCL 25 MG PO TABS: 25.0000 mg | ORAL_TABLET | Freq: Three times a day (TID) | ORAL | 0 refills | Status: DC | PRN

## 2015-11-02 MED ORDER — MECLIZINE HCL 25 MG PO TABS
50.0000 mg | ORAL_TABLET | Freq: Once | ORAL | Status: AC
  Administered 2015-11-02: 50 mg via ORAL
  Filled 2015-11-02: qty 2

## 2015-11-02 MED ORDER — DIAZEPAM 5 MG PO TABS: 5.0000 mg | ORAL_TABLET | Freq: Two times a day (BID) | ORAL | 0 refills | Status: AC | PRN

## 2015-11-02 MED ORDER — CYCLOBENZAPRINE HCL 10 MG PO TABS
5.0000 mg | ORAL_TABLET | Freq: Once | ORAL | Status: AC
  Administered 2015-11-02: 5 mg via ORAL
  Filled 2015-11-02: qty 1

## 2015-11-02 NOTE — ED Triage Notes (Signed)
Pt c/o dizziness with N/V since yesterday morning. Pt states this morning she woke up with tightness "like a pulled musle" in central chest that radiates into the back.Taylor Morris

## 2015-11-02 NOTE — ED Provider Notes (Signed)
[facility] Emergency Department Provider Note  ____________________________________________  Time seen: Approximately 8:22 AM  I have reviewed the triage vital signs and the nursing notes.   HISTORY  Chief Complaint Dizziness; Emesis; and Chest Pain   HPI Taylor Morris is a 50 y.o. female with a history of anxiety and smoking who presents for evaluation of upper back pain. Patient reports that she woke up yesterday feeling dizzy like the room was spinning. She reports multiple episodes of nonbloody nonbilious emesis and diarrhea since yesterday. Has had vertigo intermittent for the last few weeks usually positional that resolves at rest.  She continues to have vomiting this morning and reports that also feels like she is going to faint since this am. She noticed that her urine was very concentrated and foul-smelling. She denies any known sick contact exposure. She denies abdominal pain or fever, urinary symptoms such as dysuria or frequency. This morning when she woke up she started having constant left upper back pain that is worse with movement and with palpation. She reports that the pain feels like a muscle pull, 7/8. She reports felt very anxious and wanted to make sure she wasn't having heart attack so she came to the emergency department. She denies shortness of breath, cough, hemoptysis, abdominal pain, chest pain. She also endorses a diffuse, throbbing, 9/10 headache that has been present since yesterday when she started getting sick. She reports that the headache started mild and it got progressively worse. She denies any changes in vision, facial droop, weakness or numbness of her extremities, diplopia, dysarthria, gait instability. She also denies personal or family history of ischemic heart disease, DVT PE, exogenous hormones, recent travel immobilization, leg pain or swelling. She hasn't taken anything for the pain. She denies alcohol use. She is smokers.  She denies f/h ischemic heart disease and stroke.  Past Medical History:  Diagnosis Date  . Anxiety   . Arthritis   . Complication of anesthesia   . H/O cesarean section    times 2  . PONV (postoperative nausea and vomiting)     There are no active problems to display for this patient.   Past Surgical History:  Procedure Laterality Date  . ABDOMINAL HYSTERECTOMY    . BREAST BIOPSY Right 07/2014   surgery to remove abcess 3:00/ Dr Marina Gravel  . BREAST DUCTAL SYSTEM EXCISION Right 07/22/2014   Procedure: right breast major duct excision ;  Surgeon: Sherri Rad, MD;  Location: [facility] ORS;  Service: General;  Laterality: Right;  . CESAREAN SECTION     times 2  . CHOLECYSTECTOMY    . INCISION AND DRAINAGE BREAST ABSCESS Right 05/16  . TUBAL LIGATION      Prior to Admission medications   Medication Sig Start Date End Date Taking? Authorizing Provider  clonazePAM (KLONOPIN) 0.5 MG tablet Take 0.5 mg by mouth daily as needed for anxiety.   Yes Historical Provider, MD  diazepam (VALIUM) 5 MG tablet Take 1 tablet (5 mg total) by mouth 2 (two) times daily as needed for anxiety. 11/02/15 11/01/16  Rudene Re, MD  meclizine (ANTIVERT) 25 MG tablet Take 1 tablet (25 mg total) by mouth 3 (three) times daily as needed for dizziness. 11/02/15   Rudene Re, MD    Allergies Review of patient's allergies indicates no known allergies.  Family History  Problem Relation Age of Onset  . Breast cancer Maternal Aunt 68    Social History Social History  Substance Use Topics  .  Smoking status: Current Some Day Smoker    Packs/day: 0.25    Years: 30.00  . Smokeless tobacco: Current User  . Alcohol use No    Review of Systems  Constitutional: Negative for fever. + Lightheadedness Eyes: Negative for visual changes. ENT: Negative for sore throat. Cardiovascular: Negative for chest pain. Respiratory: Negative for shortness of breath. Gastrointestinal: Negative for abdominal pain. +  N/V/D Genitourinary: Negative for dysuria. Musculoskeletal: + upper back pain. Skin: Negative for rash. Neurological: Negative for headaches, weakness or numbness.  ____________________________________________   PHYSICAL EXAM:  VITAL SIGNS: ED Triage Vitals  Enc Vitals Group     BP 11/02/15 0802 (!) 154/80     Pulse Rate 11/02/15 0802 65     Resp 11/02/15 0802 20     Temp 11/02/15 0802 98.1 F (36.7 C)     Temp Source 11/02/15 0802 Oral     SpO2 11/02/15 0802 100 %     Weight 11/02/15 0801 170 lb (77.1 kg)     Height 11/02/15 0801 5\' 2"  (1.575 m)     Head Circumference --      Peak Flow --      Pain Score 11/02/15 0801 7     Pain Loc --      Pain Edu? --      Excl. in [place]? --     Constitutional: Alert and oriented. Well appearing and in no apparent distress. HEENT:      Head: Normocephalic and atraumatic.         Eyes: Conjunctivae are normal. Sclera is non-icteric. EOMI. PERRL      Ears: TMs visualized and clear      Mouth/Throat: Mucous membranes are moist.       Neck: Supple with no signs of meningismus. Cardiovascular: Regular rate and rhythm. No murmurs, gallops, or rubs. 2+ symmetrical distal pulses are present in all extremities. No JVD. Respiratory: Normal respiratory effort. Lungs are clear to auscultation bilaterally. No wheezes, crackles, or rhonchi.  Gastrointestinal: Soft, non tender, and non distended with positive bowel sounds. No rebound or guarding. Genitourinary: No CVA tenderness. Musculoskeletal: tender to palpation over the paraspinal muscles on the left in the mid thoracic region. Nontender with normal range of motion in all extremities. No edema, cyanosis, or erythema of extremities. Neurologic: Normal speech and language. A & O x3, PERRL, no nystagmus, CN II-XII intact, motor testing reveals good tone and bulk throughout. There is no evidence of pronator drift or dysmetria. Muscle strength is 5/5 throughout. Deep tendon reflexes are 2+ throughout with  downgoing toes. Sensory examination is intact. Gait is normal. Skin: Skin is warm, dry and intact. No rash noted. Psychiatric: Mood and affect are normal. Speech and behavior are normal.  ____________________________________________   LABS (all labs ordered are listed, but only abnormal results are displayed)  Labs Reviewed  CBC WITH DIFFERENTIAL/PLATELET - Abnormal; Notable for the following:       Result Value   Hemoglobin 16.2 (*)    All other components within normal limits  COMPREHENSIVE METABOLIC PANEL - Abnormal; Notable for the following:    Potassium 3.3 (*)    Glucose, Bld 100 (*)    ALT 11 (*)    All other components within normal limits  URINALYSIS COMPLETEWITH MICROSCOPIC ([facility] ONLY) - Abnormal; Notable for the following:    Color, Urine STRAW (*)    APPearance CLEAR (*)    Specific Gravity, Urine 1.004 (*)    Hgb urine dipstick 1+ (*)  Bacteria, UA RARE (*)    Squamous Epithelial / LPF 6-30 (*)    All other components within normal limits  TROPONIN I  LIPASE, BLOOD  TROPONIN I   ____________________________________________  EKG  ED ECG REPORT I, Rudene Re, the attending physician, personally viewed and interpreted this ECG.  Normal sinus rhythm, rate of 72, normal intervals, normal axis, no ST elevations or depressions. No prior for comparison ____________________________________________  RADIOLOGY  CXR: negative ____________________________________________   PROCEDURES  Procedure(s) performed: None Procedures Critical Care performed:  None ____________________________________________   INITIAL IMPRESSION / ASSESSMENT AND PLAN / ED COURSE  50 y.o. female with a history of anxiety and smoking who presents for evaluation of multiple medical complaints.  # N/V/D: Patient with 24 hours of multiple episodes of watery diarrhea, nausea and vomiting. No fever, no abdominal pain. We'll check electrolytes, lipase, CMP, give IV fluids and  Zofran  # lightheadedness: In the setting of V/D. Possibly dehydration. No neuro deficits.  # vertigo: Intermittent and positional usually when she turns her head to the left. Prior history of vertigo. No neurological deficits. Patient has had these symptoms intermittently for the last few weeks. Here she is completely neurologically intact. Symptoms resolved at this time.   # Upper back pain: reproducible on exam, lungs/heart/abdominal exam normal. Will treat with toradol  Plan: EKG, troponin, CXR, electrolytes. IVF, IV zofran, IV toradol and reassess  Clinical Course  Comment By Time  Patient reports lightheadedness is improved but still persistent. Labs WNL. VS stable. Also reports no improvement on the back pain which is again noted to be reproducible on palpation. Will give flexeril and lidoderm patch. Will also give another fluid bolus and reassess. Patient remains neuro intact. Rudene Re, MD 08/21 1051  Patient reports that her lightheadedness has resolved and the back pain has markedly improved lidocaine patch. She reports that when she got up to go to the bathroom her vertigo started again. She reports that the minute she sits on the bed resolves. She remains neurologically intact with no acute findings. She was able to ambulate without any difficulty, she has no nystagmus, no pronator drift, no dysmteria, no truncal ataxia, no visual field deficits. We'll start her on meclizine and Valium and discharge her home for f/u with PCP. 2nd troponin pending Rudene Re, MD 08/21 1307  Patient reports full resolution of her symptoms with meclizine and Valium. She is ambulated with no difficulty, no longer having vertigo. Her back pain remains well controlled at the Lidoderm patch. We'll discharge home with close follow-up with PCP. Second troponin was negative. Rudene Re, MD 08/21 1428    Pertinent labs & imaging results that were available during my care of the patient were  reviewed by me and considered in my medical decision making (see chart for details).    ____________________________________________   FINAL CLINICAL IMPRESSION(S) / ED DIAGNOSES  Final diagnoses:  Vertigo  Nausea vomiting and diarrhea  Left-sided thoracic back pain      NEW MEDICATIONS STARTED DURING THIS VISIT:  Discharge Medication List as of 11/02/2015  2:17 PM    START taking these medications   Details  diazepam (VALIUM) 5 MG tablet Take 1 tablet (5 mg total) by mouth 2 (two) times daily as needed for anxiety., Starting Mon 11/02/2015, Until Tue 11/01/2016, Print    meclizine (ANTIVERT) 25 MG tablet Take 1 tablet (25 mg total) by mouth 3 (three) times daily as needed for dizziness., Starting Mon 11/02/2015, Print  Note:  This document was prepared using Dragon voice recognition software and may include unintentional dictation errors.    Rudene Re, MD 11/02/15 2151

## 2015-11-02 NOTE — Discharge Instructions (Signed)
Follow-up with your doctor in 2 days. Return to the emergency department if you have new or worsening chest pain, dizziness, facial droop, changes in vision, difficulty walking, difficulty speaking, difficulty swallowing, or any other symptoms concerning to you.

## 2015-11-02 NOTE — ED Notes (Signed)
Patient transported to X-ray 

## 2015-11-02 NOTE — ED Notes (Signed)
Patient placed in room and hooked up to the monitor, nurse met in the room.

## 2015-11-04 ENCOUNTER — Other Ambulatory Visit: Payer: Self-pay | Admitting: Nurse Practitioner

## 2015-11-04 DIAGNOSIS — R1013 Epigastric pain: Secondary | ICD-10-CM

## 2015-11-12 ENCOUNTER — Ambulatory Visit: Payer: No Typology Code available for payment source

## 2015-11-17 ENCOUNTER — Ambulatory Visit
Admission: RE | Admit: 2015-11-17 | Discharge: 2015-11-17 | Disposition: A | Discharge status: Home / Self Care | Payer: BLUE CROSS/BLUE SHIELD | Source: Ambulatory Visit | Attending: Nurse Practitioner | Admitting: Nurse Practitioner

## 2015-11-17 DIAGNOSIS — R110 Nausea: Secondary | ICD-10-CM | POA: Diagnosis not present

## 2015-11-17 DIAGNOSIS — R1013 Epigastric pain: Secondary | ICD-10-CM | POA: Diagnosis not present

## 2016-11-29 ENCOUNTER — Encounter: Payer: Self-pay | Admitting: *Deleted

## 2016-11-29 ENCOUNTER — Emergency Department
Admission: EM | Admit: 2016-11-29 | Discharge: 2016-11-29 | Disposition: A | Discharge status: Home / Self Care | Payer: No Typology Code available for payment source | Attending: Emergency Medicine | Admitting: Emergency Medicine

## 2016-11-29 DIAGNOSIS — F17200 Nicotine dependence, unspecified, uncomplicated: Secondary | ICD-10-CM | POA: Insufficient documentation

## 2016-11-29 DIAGNOSIS — R030 Elevated blood-pressure reading, without diagnosis of hypertension: Secondary | ICD-10-CM | POA: Insufficient documentation

## 2016-11-29 DIAGNOSIS — Z79899 Other long term (current) drug therapy: Secondary | ICD-10-CM | POA: Insufficient documentation

## 2016-11-29 DIAGNOSIS — H8112 Benign paroxysmal vertigo, left ear: Secondary | ICD-10-CM | POA: Insufficient documentation

## 2016-11-29 LAB — URINALYSIS, COMPLETE (UACMP) WITH MICROSCOPIC
BACTERIA UA: NONE SEEN
BILIRUBIN URINE: NEGATIVE
Glucose, UA: NEGATIVE mg/dL
KETONES UR: NEGATIVE mg/dL
Leukocytes, UA: NEGATIVE
NITRITE: NEGATIVE
Protein, ur: NEGATIVE mg/dL
Specific Gravity, Urine: 1.003 — ABNORMAL LOW (ref 1.005–1.030)
pH: 7 (ref 5.0–8.0)

## 2016-11-29 LAB — BASIC METABOLIC PANEL
Anion gap: 9 (ref 5–15)
BUN: 10 mg/dL (ref 6–20)
CALCIUM: 9.5 mg/dL (ref 8.9–10.3)
CO2: 26 mmol/L (ref 22–32)
CREATININE: 0.79 mg/dL (ref 0.44–1.00)
Chloride: 104 mmol/L (ref 101–111)
GFR calc Af Amer: >60 mL/min (ref >60)
GLUCOSE: 91 mg/dL (ref 65–99)
Potassium: 3.8 mmol/L (ref 3.5–5.1)
Sodium: 139 mmol/L (ref 135–145)

## 2016-11-29 LAB — CBC
HCT: 44.6 % (ref 35.0–47.0)
Hemoglobin: 15.3 g/dL (ref 12.0–16.0)
MCH: 31.7 pg (ref 26.0–34.0)
MCHC: 34.3 g/dL (ref 32.0–36.0)
MCV: 92.5 fL (ref 80.0–100.0)
Platelets: 230 10*3/uL (ref 150–440)
RBC: 4.82 MIL/uL (ref 3.80–5.20)
RDW: 13.6 % (ref 11.5–14.5)
WBC: 9.2 10*3/uL (ref 3.6–11.0)

## 2016-11-29 MED ORDER — MECLIZINE HCL 25 MG PO TABS
25.0000 mg | ORAL_TABLET | Freq: Once | ORAL | Status: AC
  Administered 2016-11-29: 25 mg via ORAL
  Filled 2016-11-29: qty 1

## 2016-11-29 MED ORDER — MECLIZINE HCL 25 MG PO TABS: 25.0000 mg | ORAL_TABLET | Freq: Three times a day (TID) | ORAL | 0 refills | Status: DC | PRN

## 2016-11-29 MED ORDER — HYDROCHLOROTHIAZIDE 12.5 MG PO TABS: 12.5000 mg | ORAL_TABLET | Freq: Every day | ORAL | 0 refills | Status: DC

## 2016-11-29 MED ORDER — MECLIZINE HCL 25 MG PO TABS
ORAL_TABLET | ORAL | Status: AC
  Administered 2016-11-29: 25 mg via ORAL
  Filled 2016-11-29: qty 1

## 2016-11-29 NOTE — ED Triage Notes (Signed)
States this AM she woke up feeling dizzy, states she went to [facility] for eval and brought her over due to HTN, denies any hx of HTN, states hx of vertigo

## 2016-11-29 NOTE — Discharge Instructions (Signed)
Today your blood pressure was elevated, however I am not diagnosing you with high blood pressure. Please make an appointment to follow-up with your primary care physician within one week for a reexamination and a blood pressure recheck. Return to the emergency department sooner for any new or worsening symptoms such as headache, numbness, weakness, or for any other issues whatsoever.  Please perform the Epley maneuver 4-5 times a day towards the left for the next several days to help with your vertigo.  It was a pleasure to take care of you today, and thank you for coming to our emergency department.  If you have any questions or concerns before leaving please ask the nurse to grab me and I'm more than happy to go through your aftercare instructions again.  If you were prescribed any opioid pain medication today such as Norco, Vicodin, Percocet, morphine, hydrocodone, or oxycodone please make sure you do not drive when you are taking this medication as it can alter your ability to drive safely.  If you have any concerns once you are home that you are not improving or are in fact getting worse before you can make it to your follow-up appointment, please do not hesitate to call 911 and come back for further evaluation.  Darel Hong, MD  Results for orders placed or performed during the hospital encounter of 68/12/75  Basic metabolic panel  Result Value Ref Range   Sodium 139 135 - 145 mmol/L   Potassium 3.8 3.5 - 5.1 mmol/L   Chloride 104 101 - 111 mmol/L   CO2 26 22 - 32 mmol/L   Glucose, Bld 91 65 - 99 mg/dL   BUN 10 6 - 20 mg/dL   Creatinine, Ser 0.79 0.44 - 1.00 mg/dL   Calcium 9.5 8.9 - 10.3 mg/dL   GFR calc non Af Amer >60 >60 mL/min   GFR calc Af Amer >60 >60 mL/min   Anion gap 9 5 - 15  CBC  Result Value Ref Range   WBC 9.2 3.6 - 11.0 K/uL   RBC 4.82 3.80 - 5.20 MIL/uL   Hemoglobin 15.3 12.0 - 16.0 g/dL   HCT 44.6 35.0 - 47.0 %   MCV 92.5 80.0 - 100.0 fL   MCH 31.7 26.0 -  34.0 pg   MCHC 34.3 32.0 - 36.0 g/dL   RDW 13.6 11.5 - 14.5 %   Platelets 230 150 - 440 K/uL  Urinalysis, Complete w Microscopic  Result Value Ref Range   Color, Urine COLORLESS (A) YELLOW   APPearance CLEAR (A) CLEAR   Specific Gravity, Urine 1.003 (L) 1.005 - 1.030   pH 7.0 5.0 - 8.0   Glucose, UA NEGATIVE NEGATIVE mg/dL   Hgb urine dipstick SMALL (A) NEGATIVE   Bilirubin Urine NEGATIVE NEGATIVE   Ketones, ur NEGATIVE NEGATIVE mg/dL   Protein, ur NEGATIVE NEGATIVE mg/dL   Nitrite NEGATIVE NEGATIVE   Leukocytes, UA NEGATIVE NEGATIVE   RBC / HPF 0-5 0 - 5 RBC/hpf   WBC, UA 0-5 0 - 5 WBC/hpf   Bacteria, UA NONE SEEN NONE SEEN   Squamous Epithelial / LPF 0-5 (A) NONE SEEN

## 2016-11-29 NOTE — ED Provider Notes (Signed)
[facility] Emergency Department Provider Note  ____________________________________________   First MD Initiated Contact with Patient 11/29/16 1318     (approximate)  I have reviewed the triage vital signs and the nursing notes.   HISTORY  Chief Complaint Dizziness    HPI Taylor Morris is a 51 y.o. female who self presents to the emergency department with multiple discrete episodes of room spinning vertigo that began this morning when she awoke from sleep. She has been in her usual state of health aside from a recent URI until this morning. When she awoke and sat up she had intense room spinning vertigo that lasted roughly 1-2 minutes.She initially went to the [facility] however her blood pressure was elevated so she was advised to come to the emergency department. Her symptoms do not occur at rest. She has no double vision or blurred vision. No numbness or weakness. No chest pain or shortness of breath.   Past Medical History:  Diagnosis Date  . Anxiety   . Arthritis   . Complication of anesthesia   . H/O cesarean section    times 2  . PONV (postoperative nausea and vomiting)     There are no active problems to display for this patient.   Past Surgical History:  Procedure Laterality Date  . ABDOMINAL HYSTERECTOMY    . BREAST BIOPSY Right 07/2014   surgery to remove abcess 3:00/ Dr Marina Gravel  . BREAST DUCTAL SYSTEM EXCISION Right 07/22/2014   Procedure: right breast major duct excision ;  Surgeon: Sherri Rad, MD;  Location: [facility] ORS;  Service: General;  Laterality: Right;  . CESAREAN SECTION     times 2  . CHOLECYSTECTOMY    . INCISION AND DRAINAGE BREAST ABSCESS Right 05/16  . TUBAL LIGATION      Prior to Admission medications   Medication Sig Start Date End Date Taking? Authorizing Provider  clonazePAM (KLONOPIN) 0.5 MG tablet Take 0.5 mg by mouth daily as needed for anxiety.    [provider]  hydrochlorothiazide (HYDRODIURIL)  12.5 MG tablet Take 1 tablet (12.5 mg total) by mouth daily. 11/29/16   Darel Hong, MD  meclizine (ANTIVERT) 25 MG tablet Take 1 tablet (25 mg total) by mouth 3 (three) times daily as needed for dizziness. 11/02/15   Rudene Re, MD  meclizine (ANTIVERT) 25 MG tablet Take 1 tablet (25 mg total) by mouth 3 (three) times daily as needed. 11/29/16   Darel Hong, MD    Allergies Patient has no known allergies.  Family History  Problem Relation Age of Onset  . Breast cancer Maternal Aunt 68    Social History Social History  Substance Use Topics  . Smoking status: Current Some Day Smoker    Packs/day: 0.25    Years: 30.00  . Smokeless tobacco: Current User  . Alcohol use No    Review of Systems Constitutional: No fever/chills Eyes: No visual changes. ENT: No sore throat. Cardiovascular: Denies chest pain. Respiratory: Denies shortness of breath. Gastrointestinal: No abdominal pain.  No nausea, no vomiting.  No diarrhea.  No constipation. Genitourinary: Negative for dysuria. Musculoskeletal: Negative for back pain. Skin: Negative for rash. Neurological: positive for vertigo   ____________________________________________   PHYSICAL EXAM:  VITAL SIGNS: ED Triage Vitals [11/29/16 1102]  Enc Vitals Group     BP (!) 173/109     Pulse Rate 80     Resp 16     Temp 97.9 F (36.6 C)     Temp  Source Oral     SpO2 100 %     Weight 169 lb (76.7 kg)     Height 5\' 2"  (1.575 m)     Head Circumference      Peak Flow      Pain Score      Pain Loc      Pain Edu?      Excl. in [place]?     Constitutional: alert and oriented 4 well appearing nontoxic no diaphoresis speaks full clear sentences Eyes: PERRL EOMI. Head: Atraumatic. Nose: No congestion/rhinnorhea. Mouth/Throat: No trismus Neck: No stridor.   Cardiovascular: Normal rate, regular rhythm. Grossly normal heart sounds.  Good peripheral circulation. Respiratory: Normal respiratory effort.  No retractions. Lungs  CTAB and moving good air Gastrointestinal: soft nontender Musculoskeletal: No lower extremity edema   Neurologic: Alert and oriented 4 pupils equal round and reactive to light 5 mm to 3 mm and brisk she does have lateral nystagmus on leftward gaze that is fatigable Cranial nerves II through XII intact No pronator drift 5 out of 5 grips, biceps, triceps, hip flexion, hip extension plantar flexion, dorsiflexion Sensation intact to light touch throughout 2+ DTRs and no ankle clonus Normal finger-nose-finger Ambulates with steady gait  Skin:  Skin is warm, dry and intact. No rash noted. Psychiatric: Mood and affect are normal. Speech and behavior are normal.    ____________________________________________   DIFFERENTIAL includes but not limited to  central vertigo, peripheral vertigo ____________________________________________   LABS (all labs ordered are listed, but only abnormal results are displayed)  Labs Reviewed  URINALYSIS, COMPLETE (UACMP) WITH MICROSCOPIC - Abnormal; Notable for the following:       Result Value   Color, Urine COLORLESS (*)    APPearance CLEAR (*)    Specific Gravity, Urine 1.003 (*)    Hgb urine dipstick SMALL (*)    Squamous Epithelial / LPF 0-5 (*)    All other components within normal limits  BASIC METABOLIC PANEL  CBC    blood work unremarkable no signs of urinary tract infection __________________________________________  EKG  ED ECG REPORT I, Darel Hong, the attending physician, personally viewed and interpreted this ECG.  Date: 11/29/2016 EKG Time:  Rate: 72 Rhythm: normal sinus rhythm QRS Axis: normal Intervals: normal ST/T Wave abnormalities: normal Narrative Interpretation: no evidence of acute ischemia  ____________________________________________  RADIOLOGY   ____________________________________________   PROCEDURES  Procedure(s) performed: no  Procedures  Critical Care performed: no  Observation:  no ____________________________________________   INITIAL IMPRESSION / ASSESSMENT AND PLAN / ED COURSE  Pertinent labs & imaging results that were available during my care of the patient were reviewed by me and considered in my medical decision making (see chart for details).  The patient arrives very well-appearing with clear leftward beating nystagmus. Her symptoms last 1-2 minutes and do not occur at rest. They're discrete with fatigable vertigo. Symptoms are most consistent with benign paroxysmal positional vertigo. I explained to the patient had a performed Epley maneuver and she verbalized understanding. She does have elevated blood pressure today although it is unclear if this is secondary to the stress of her current situation. I will start her on low-dose Hydrocort Dyazide and refer her back to primary care. She is discharged home in improved condition.      ____________________________________________   FINAL CLINICAL IMPRESSION(S) / ED DIAGNOSES  Final diagnoses:  Benign paroxysmal positional vertigo of left ear  Elevated blood pressure reading      NEW MEDICATIONS STARTED DURING  THIS VISIT:  Discharge Medication List as of 11/29/2016  1:39 PM    START taking these medications   Details  hydrochlorothiazide (HYDRODIURIL) 12.5 MG tablet Take 1 tablet (12.5 mg total) by mouth daily., Starting Tue 11/29/2016, Print    !! meclizine (ANTIVERT) 25 MG tablet Take 1 tablet (25 mg total) by mouth 3 (three) times daily as needed., Starting Tue 11/29/2016, Print     !! - Potential duplicate medications found. Please discuss with provider.       Note:  This document was prepared using Dragon voice recognition software and may include unintentional dictation errors.     Darel Hong, MD 11/29/16 2250

## 2017-07-10 ENCOUNTER — Ambulatory Visit: Payer: Medicaid Other | Admitting: Nurse Practitioner

## 2017-07-10 ENCOUNTER — Encounter: Payer: Self-pay | Admitting: Nurse Practitioner

## 2017-07-10 VITALS — BP 141/80 | HR 75 | Resp 16 | Ht 62.0 in | Wt 176.8 lb

## 2017-07-10 DIAGNOSIS — G441 Vascular headache, not elsewhere classified: Secondary | ICD-10-CM

## 2017-07-10 DIAGNOSIS — M5030 Other cervical disc degeneration, unspecified cervical region: Secondary | ICD-10-CM

## 2017-07-10 DIAGNOSIS — R51 Headache: Secondary | ICD-10-CM

## 2017-07-10 DIAGNOSIS — I10 Essential (primary) hypertension: Secondary | ICD-10-CM

## 2017-07-10 DIAGNOSIS — F411 Generalized anxiety disorder: Secondary | ICD-10-CM | POA: Diagnosis not present

## 2017-07-10 DIAGNOSIS — R519 Headache, unspecified: Secondary | ICD-10-CM | POA: Insufficient documentation

## 2017-07-10 MED ORDER — LISINOPRIL 5 MG PO TABS: 5.0000 mg | ORAL_TABLET | Freq: Every day | ORAL | 2 refills | Status: DC

## 2017-07-10 MED ORDER — CLONAZEPAM 0.5 MG PO TABS: 0.5000 mg | ORAL_TABLET | Freq: Every day | ORAL | 2 refills | Status: DC | PRN

## 2017-07-10 NOTE — Progress Notes (Signed)
[facility] [place], [place] [address]  Internal MEDICINE  Office Visit Note  Patient Name: Taylor Morris  [id number]  [id number]  Date of Service: 07/10/2017   Pt is here for routine follow up.   Chief Complaint  Patient presents with  . Neck Pain    straightening her posture makes her head hurt bad.     The patient is c/o significant headache and neck pain. She is concerned about aneurysm. She states her great aunt and an aunt, both passed away from cerebral aneurysm. The way her head hurts, she feels like there is a lot of pressure and might just explode. Straightening her posture to sit up, causes her pain in neck and head to be much worse. She does see little black spots and has episodes of vertigo with these headaches. She states that this headache is not like migraines she has had in the past. Dark and quiet room does not help the pain. Has to keep moving to find a position where she can relieve pressure type feeling in her head. She states that this is the worst headache she has ever had.   A neck x-ray was done in 2017, showing some spondylosis in cervical spine. She was started on ibuprofen 800mg  as needed. She is now taking naproxen 500mg  twice daily to help with pain and this is not helping.       Current Medication: Outpatient Encounter Medications as of 07/10/2017  Medication Sig  . clonazePAM (KLONOPIN) 0.5 MG tablet Take 1 tablet (0.5 mg total) by mouth daily as needed for anxiety.  Marland Kitchen lisinopril (PRINIVIL,ZESTRIL) 5 MG tablet Take 1 tablet (5 mg total) by mouth daily.  . [DISCONTINUED] clonazePAM (KLONOPIN) 0.5 MG tablet Take 0.5 mg by mouth daily as needed for anxiety.  . [DISCONTINUED] lisinopril (PRINIVIL,ZESTRIL) 5 MG tablet Take by mouth.  Marland Kitchen ibuprofen (ADVIL,MOTRIN) 200 MG tablet Take by mouth.  . meclizine (ANTIVERT) 25 MG tablet Take 1 tablet (25 mg total) by mouth 3 (three) times daily as needed for dizziness. (Patient not taking:  Reported on 07/10/2017)  . meclizine (ANTIVERT) 25 MG tablet Take 1 tablet (25 mg total) by mouth 3 (three) times daily as needed. (Patient not taking: Reported on 07/10/2017)  . [DISCONTINUED] hydrochlorothiazide (HYDRODIURIL) 12.5 MG tablet Take 1 tablet (12.5 mg total) by mouth daily. (Patient not taking: Reported on 07/10/2017)   No facility-administered encounter medications on file as of 07/10/2017.     Surgical History: Past Surgical History:  Procedure Laterality Date  . ABDOMINAL HYSTERECTOMY    . BREAST BIOPSY Right 07/2014   surgery to remove abcess 3:00/ Dr Marina Gravel  . BREAST DUCTAL SYSTEM EXCISION Right 07/22/2014   Procedure: right breast major duct excision ;  Surgeon: Sherri Rad, MD;  Location: [facility] ORS;  Service: General;  Laterality: Right;  . CESAREAN SECTION     times 2  . CHOLECYSTECTOMY    . INCISION AND DRAINAGE BREAST ABSCESS Right 05/16  . TUBAL LIGATION      Medical History: Past Medical History:  Diagnosis Date  . Anxiety   . Arthritis   . Complication of anesthesia   . H/O cesarean section    times 2  . PONV (postoperative nausea and vomiting)     Family History: Family History  Problem Relation Age of Onset  . Breast cancer Maternal Aunt 68    Social History   Socioeconomic History  . Marital status: Widowed    Spouse name:  Not on file  . Number of children: Not on file  . Years of education: Not on file  . Highest education level: Not on file  Occupational History  . Not on file  Social Needs  . Financial resource strain: Not on file  . Food insecurity:    Worry: Not on file    Inability: Not on file  . Transportation needs:    Medical: Not on file    Non-medical: Not on file  Tobacco Use  . Smoking status: Current Some Day Smoker    Packs/day: 0.25    Years: 30.00    Pack years: 7.50  . Smokeless tobacco: Current User  Substance and Sexual Activity  . Alcohol use: No  . Drug use: No  . Sexual activity: Not on file  Lifestyle  .  Physical activity:    Days per week: Not on file    Minutes per session: Not on file  . Stress: Not on file  Relationships  . Social connections:    Talks on phone: Not on file    Gets together: Not on file    Attends religious service: Not on file    Active member of club or organization: Not on file    Attends meetings of clubs or organizations: Not on file    Relationship status: Not on file  . Intimate partner violence:    Fear of current or ex partner: Not on file    Emotionally abused: Not on file    Physically abused: Not on file    Forced sexual activity: Not on file  Other Topics Concern  . Not on file  Social History Narrative  . Not on file      Review of Systems  Constitutional: Negative for chills, fatigue and unexpected weight change.  HENT: Positive for postnasal drip. Negative for congestion, rhinorrhea, sneezing and sore throat.   Eyes: Negative for redness.  Respiratory: Negative for cough, chest tightness and shortness of breath.   Cardiovascular: Negative for chest pain and palpitations.  Gastrointestinal: Negative for abdominal pain, constipation, diarrhea, nausea and vomiting.  Genitourinary: Negative for dysuria and frequency.  Musculoskeletal: Negative for arthralgias, back pain, joint swelling and neck pain.  Skin: Negative for rash.  Neurological: Negative.  Negative for tremors and numbness.  Hematological: Negative for adenopathy. Does not bruise/bleed easily.  Psychiatric/Behavioral: Negative for behavioral problems (Depression), sleep disturbance and suicidal ideas. The patient is not nervous/anxious.     Today's Vitals   07/10/17 1039  BP: (!) 141/80  Pulse: 75  Resp: 16  SpO2: 99%  Weight: 176 lb 12.8 oz (80.2 kg)  Height: 5\' 2"  (1.575 m)    Physical Exam  Constitutional: She is oriented to person, place, and time. She appears well-developed and well-nourished. No distress.  HENT:  Head: Normocephalic and atraumatic.  Mouth/Throat:  Oropharynx is clear and moist. No oropharyngeal exudate.  Eyes: Pupils are equal, round, and reactive to light. EOM are normal.  Neck: Neck supple. No JVD present. Spinous process tenderness present. Edema and decreased range of motion present. No tracheal deviation present. No thyromegaly present.    Cardiovascular: Normal rate, regular rhythm and normal heart sounds. Exam reveals no gallop and no friction rub.  No murmur heard. Pulmonary/Chest: Effort normal and breath sounds normal. No respiratory distress. She has no wheezes. She has no rales. She exhibits no tenderness.  Abdominal: Soft. Bowel sounds are normal. There is no tenderness. There is no guarding.  Lymphadenopathy:  She has no cervical adenopathy.  Neurological: She is alert and oriented to person, place, and time. No cranial nerve deficit.  Patient is awake, alert, and oriented to person, place, time, and situation. Answers all questions appropriately. Pupils are equal, round, and reactive to light and accomodation. Cranial nerves are intact .  Skin: Skin is warm and dry. Capillary refill takes less than 2 seconds. She is not diaphoretic.  Psychiatric: She has a normal mood and affect. Her behavior is normal. Judgment and thought content normal.  Nursing note and vitals reviewed.  Assessment/Plan:  1. Other vascular headache Described as the worst headache she's ever had. MRI ordered for further evaluation.  - MR Brain W Wo Contrast; Future  2. Other cervical disc degeneration, unspecified cervical region X-ray done 2017 did show mild spondylitis of cervical spine. Has been on NSAIDs for over 6 months. Pain getting worse. Will get MRI for further evaluation.   - MR Cervical Spine Wo Contrast; Future  3. Essential hypertension stable - lisinopril (PRINIVIL,ZESTRIL) 5 MG tablet; Take 1 tablet (5 mg total) by mouth daily.  Dispense: 30 tablet; Refill: 2  4. Generalized anxiety disorder May continue clonazepam 0.5mg  at  night as needed. New rx sent to her pharmacy.  - clonazePAM (KLONOPIN) 0.5 MG tablet; Take 1 tablet (0.5 mg total) by mouth daily as needed for anxiety.  Dispense: 30 tablet; Refill: 2  General Counseling: Kavina verbalizes understanding of the findings of todays visit and agrees with plan of treatment. I have discussed any further diagnostic evaluation that may be needed or ordered today. We also reviewed her medications today. she has been encouraged to call the office with any questions or concerns that should arise related to todays visit.  This patient was seen by Leretha Pol, FNP- C in Collaboration with Dr Lavera Guise as a part of collaborative care agreement    Orders Placed This Encounter  Procedures  . MR Cervical Spine Wo Contrast  . MR Brain W Wo Contrast    Meds ordered this encounter  Medications  . clonazePAM (KLONOPIN) 0.5 MG tablet    Sig: Take 1 tablet (0.5 mg total) by mouth daily as needed for anxiety.    Dispense:  30 tablet    Refill:  2    Order Specific Question:   Supervising Provider    Answer:   Lavera Guise [[id number]]  . lisinopril (PRINIVIL,ZESTRIL) 5 MG tablet    Sig: Take 1 tablet (5 mg total) by mouth daily.    Dispense:  30 tablet    Refill:  2    Order Specific Question:   Supervising Provider    Answer:   Lavera Guise [[id number]]    Time spent: 15 Minutes      Dr Lavera Guise Internal medicine

## 2017-07-22 ENCOUNTER — Ambulatory Visit
Admission: RE | Admit: 2017-07-22 | Discharge: 2017-07-22 | Disposition: A | Discharge status: Home / Self Care | Payer: Medicaid Other | Source: Ambulatory Visit | Attending: Nurse Practitioner | Admitting: Nurse Practitioner

## 2017-07-22 DIAGNOSIS — M4802 Spinal stenosis, cervical region: Secondary | ICD-10-CM | POA: Insufficient documentation

## 2017-07-22 DIAGNOSIS — J323 Chronic sphenoidal sinusitis: Secondary | ICD-10-CM | POA: Diagnosis not present

## 2017-07-22 DIAGNOSIS — M5030 Other cervical disc degeneration, unspecified cervical region: Secondary | ICD-10-CM | POA: Diagnosis not present

## 2017-07-22 DIAGNOSIS — G441 Vascular headache, not elsewhere classified: Secondary | ICD-10-CM | POA: Diagnosis not present

## 2017-07-22 DIAGNOSIS — M47812 Spondylosis without myelopathy or radiculopathy, cervical region: Secondary | ICD-10-CM | POA: Insufficient documentation

## 2017-07-22 LAB — POCT I-STAT CREATININE: CREATININE: 0.8 mg/dL (ref 0.44–1.00)

## 2017-07-22 MED ORDER — GADOBENATE DIMEGLUMINE 529 MG/ML IV SOLN
16.0000 mL | Freq: Once | INTRAVENOUS | Status: AC | PRN
  Administered 2017-07-22: 16 mL via INTRAVENOUS

## 2017-07-31 ENCOUNTER — Encounter: Payer: Self-pay | Admitting: Nurse Practitioner

## 2017-07-31 ENCOUNTER — Ambulatory Visit: Payer: Medicaid Other | Admitting: Nurse Practitioner

## 2017-07-31 VITALS — BP 154/98 | HR 80 | Resp 16 | Ht 62.0 in | Wt 176.6 lb

## 2017-07-31 DIAGNOSIS — J323 Chronic sphenoidal sinusitis: Secondary | ICD-10-CM | POA: Diagnosis not present

## 2017-07-31 DIAGNOSIS — M5030 Other cervical disc degeneration, unspecified cervical region: Secondary | ICD-10-CM

## 2017-07-31 DIAGNOSIS — I10 Essential (primary) hypertension: Secondary | ICD-10-CM | POA: Diagnosis not present

## 2017-07-31 DIAGNOSIS — F411 Generalized anxiety disorder: Secondary | ICD-10-CM | POA: Diagnosis not present

## 2017-07-31 DIAGNOSIS — Z1239 Encounter for other screening for malignant neoplasm of breast: Secondary | ICD-10-CM

## 2017-07-31 DIAGNOSIS — Z1231 Encounter for screening mammogram for malignant neoplasm of breast: Secondary | ICD-10-CM | POA: Diagnosis not present

## 2017-07-31 MED ORDER — FLUTICASONE PROPIONATE 50 MCG/ACT NA SUSP: 2.0000 | Freq: Every day | NASAL | 6 refills | Status: DC

## 2017-07-31 MED ORDER — DOXYCYCLINE HYCLATE 100 MG PO CAPS: 100.0000 mg | ORAL_CAPSULE | Freq: Two times a day (BID) | ORAL | 0 refills | Status: DC

## 2017-07-31 NOTE — Progress Notes (Signed)
[facility] [place], [place] [address]  Internal MEDICINE  Office Visit Note  Patient Name: Taylor Morris  [id number]  [id number]  Date of Service: 08/23/2017   Pt is here for routine follow up.   Chief Complaint  Patient presents with  . Follow-up    mri results     The patient continues to have significant headache and neck pain. The way her head hurts, she feels like there is a lot of pressure and might just explode. Straightening her posture to sit up, causes her pain in neck and head to be much worse. She does see little black spots and has episodes of vertigo with these headaches. She states that this headache is not like migraines she has had in the past. Dark and quiet room does not help the pain. Has to keep moving to find a position where she can relieve pressure type feeling in her head. She states that this is the worst headache she has ever had.   A neck x-ray was done in 2017, showing some spondylosis in cervical spine. She was started on ibuprofen 800mg  as needed. She is now taking naproxen 500mg  twice daily to help with pain and this is not helping.       Current Medication: Outpatient Encounter Medications as of 07/31/2017  Medication Sig  . clonazePAM (KLONOPIN) 0.5 MG tablet Take 1 tablet (0.5 mg total) by mouth daily as needed for anxiety.  Marland Kitchen doxycycline (VIBRAMYCIN) 100 MG capsule Take 1 capsule (100 mg total) by mouth 2 (two) times daily.  . fluticasone (FLONASE) 50 MCG/ACT nasal spray Place 2 sprays into both nostrils daily.  Marland Kitchen ibuprofen (ADVIL,MOTRIN) 200 MG tablet Take by mouth.  Marland Kitchen lisinopril (PRINIVIL,ZESTRIL) 5 MG tablet Take 1 tablet (5 mg total) by mouth daily.  . meclizine (ANTIVERT) 25 MG tablet Take 1 tablet (25 mg total) by mouth 3 (three) times daily as needed for dizziness. (Patient not taking: Reported on 07/10/2017)  . meclizine (ANTIVERT) 25 MG tablet Take 1 tablet (25 mg total) by mouth 3 (three) times daily as needed.  (Patient not taking: Reported on 07/10/2017)   No facility-administered encounter medications on file as of 07/31/2017.     Surgical History: Past Surgical History:  Procedure Laterality Date  . ABDOMINAL HYSTERECTOMY    . BREAST BIOPSY Right 07/2014   surgery to remove abcess 3:00/ Dr Marina Gravel  . BREAST DUCTAL SYSTEM EXCISION Right 07/22/2014   Procedure: right breast major duct excision ;  Surgeon: Sherri Rad, MD;  Location: [facility] ORS;  Service: General;  Laterality: Right;  . CESAREAN SECTION     times 2  . CHOLECYSTECTOMY    . INCISION AND DRAINAGE BREAST ABSCESS Right 05/16  . TUBAL LIGATION      Medical History: Past Medical History:  Diagnosis Date  . Anxiety   . Arthritis   . Complication of anesthesia   . H/O cesarean section    times 2  . PONV (postoperative nausea and vomiting)     Family History: Family History  Problem Relation Age of Onset  . Breast cancer Maternal Aunt 68    Social History   Socioeconomic History  . Marital status: Widowed    Spouse name: Not on file  . Number of children: Not on file  . Years of education: Not on file  . Highest education level: Not on file  Occupational History  . Not on file  Social Needs  . Financial resource strain:  Not on file  . Food insecurity:    Worry: Not on file    Inability: Not on file  . Transportation needs:    Medical: Not on file    Non-medical: Not on file  Tobacco Use  . Smoking status: Current Some Day Smoker    Packs/day: 0.25    Years: 30.00    Pack years: 7.50  . Smokeless tobacco: Current User  Substance and Sexual Activity  . Alcohol use: No  . Drug use: No  . Sexual activity: Not on file  Lifestyle  . Physical activity:    Days per week: Not on file    Minutes per session: Not on file  . Stress: Not on file  Relationships  . Social connections:    Talks on phone: Not on file    Gets together: Not on file    Attends religious service: Not on file    Active member of club or  organization: Not on file    Attends meetings of clubs or organizations: Not on file    Relationship status: Not on file  . Intimate partner violence:    Fear of current or ex partner: Not on file    Emotionally abused: Not on file    Physically abused: Not on file    Forced sexual activity: Not on file  Other Topics Concern  . Not on file  Social History Narrative  . Not on file   Review of Systems  Constitutional: Negative for chills, fatigue and unexpected weight change.  HENT: Negative for congestion, postnasal drip, rhinorrhea, sneezing and sore throat.   Eyes: Negative.  Negative for redness.  Respiratory: Negative for cough, chest tightness, shortness of breath and wheezing.   Cardiovascular: Negative for chest pain and palpitations.  Gastrointestinal: Negative for abdominal pain, constipation, diarrhea, nausea and vomiting.  Endocrine: Negative for cold intolerance, heat intolerance, polydipsia, polyphagia and polyuria.  Genitourinary: Negative for dysuria and frequency.  Musculoskeletal: Positive for back pain and neck pain. Negative for arthralgias and joint swelling.  Skin: Negative for rash.  Allergic/Immunologic: Negative for environmental allergies.  Neurological: Positive for headaches. Negative for tremors, weakness and numbness.  Hematological: Negative for adenopathy. Does not bruise/bleed easily.  Psychiatric/Behavioral: Negative for behavioral problems (Depression), sleep disturbance and suicidal ideas. The patient is not nervous/anxious.     Today's Vitals   07/31/17 1609  BP: (!) 154/98  Pulse: 80  Resp: 16  SpO2: 96%  Weight: 176 lb 9.6 oz (80.1 kg)  Height: 5\' 2"  (1.575 m)    Physical Exam  Constitutional: She is oriented to person, place, and time. She appears well-developed and well-nourished. No distress.  HENT:  Head: Normocephalic and atraumatic.  Mouth/Throat: Oropharynx is clear and moist. No oropharyngeal exudate.  Eyes: Pupils are equal,  round, and reactive to light. Conjunctivae and EOM are normal.  Neck: Neck supple. No JVD present. Spinous process tenderness present. Edema and decreased range of motion present. No tracheal deviation present. No thyromegaly present.    Cardiovascular: Normal rate, regular rhythm and normal heart sounds. Exam reveals no gallop and no friction rub.  No murmur heard. Pulmonary/Chest: Effort normal and breath sounds normal. No respiratory distress. She has no wheezes. She has no rales. She exhibits no tenderness.  Abdominal: Soft. Bowel sounds are normal. There is no tenderness. There is no guarding.  Lymphadenopathy:    She has no cervical adenopathy.  Neurological: She is alert and oriented to person, place, and time. No cranial  nerve deficit.  Patient is awake, alert, and oriented to person, place, time, and situation. Answers all questions appropriately. Pupils are equal, round, and reactive to light and accomodation. Cranial nerves are intact .  Skin: Skin is warm and dry. Capillary refill takes less than 2 seconds. She is not diaphoretic.  Psychiatric: She has a normal mood and affect. Her behavior is normal. Judgment and thought content normal.  Nursing note and vitals reviewed.  Assessment/Plan: 1. Chronic sphenoidal sinusitis May be contributing to dizziness. Add doxycycline 100mg  twice daily for 21 days. Start fluticasone naal spray.  - doxycycline (VIBRAMYCIN) 100 MG capsule; Take 1 capsule (100 mg total) by mouth 2 (two) times daily.  Dispense: 42 capsule; Refill: 0 - fluticasone (FLONASE) 50 MCG/ACT nasal spray; Place 2 sprays into both nostrils daily.  Dispense: 16 g; Refill: 6  2. Other cervical disc degeneration, unspecified cervical region MRI showing multilevel disc protrusion and moderate degenerative changes of c-spine. Will continue to take NSAIDs. Refer to ortho/neurosurgery as indicated.   3. Screening for breast cancer - MM DIGITAL SCREENING BILATERAL; Future  4.  Essential hypertension Stable. Continue bp medication as prescribed.   5. Generalized anxiety disorder May continue clonazepam as needed and as prescribed.   General Counseling: fleta borgeson understanding of the findings of todays visit and agrees with plan of treatment. I have discussed any further diagnostic evaluation that may be needed or ordered today. We also reviewed her medications today. she has been encouraged to call the office with any questions or concerns that should arise related to todays visit.    Counseling:  This patient was seen by Leretha Pol, FNP- C in Collaboration with Dr Lavera Guise as a part of collaborative care agreement  Orders Placed This Encounter  Procedures  . MM DIGITAL SCREENING BILATERAL    Meds ordered this encounter  Medications  . doxycycline (VIBRAMYCIN) 100 MG capsule    Sig: Take 1 capsule (100 mg total) by mouth 2 (two) times daily.    Dispense:  42 capsule    Refill:  0    Order Specific Question:   Supervising Provider    Answer:   Lavera Guise [[id number]]  . fluticasone (FLONASE) 50 MCG/ACT nasal spray    Sig: Place 2 sprays into both nostrils daily.    Dispense:  16 g    Refill:  6    Order Specific Question:   Supervising Provider    Answer:   Lavera Guise [[id number]]    Time spent: 70 Minutes      Dr Lavera Guise Internal medicine

## 2017-08-23 DIAGNOSIS — J323 Chronic sphenoidal sinusitis: Secondary | ICD-10-CM | POA: Insufficient documentation

## 2017-08-23 DIAGNOSIS — Z124 Encounter for screening for malignant neoplasm of cervix: Secondary | ICD-10-CM | POA: Insufficient documentation

## 2017-08-23 DIAGNOSIS — Z0001 Encounter for general adult medical examination with abnormal findings: Secondary | ICD-10-CM

## 2017-10-20 ENCOUNTER — Encounter: Payer: Self-pay | Admitting: Nurse Practitioner

## 2017-10-20 ENCOUNTER — Ambulatory Visit: Payer: Medicaid Other | Admitting: Nurse Practitioner

## 2017-10-20 VITALS — BP 140/84 | HR 75 | Resp 16 | Ht 63.0 in | Wt 175.4 lb

## 2017-10-20 DIAGNOSIS — Z0001 Encounter for general adult medical examination with abnormal findings: Secondary | ICD-10-CM | POA: Diagnosis not present

## 2017-10-20 DIAGNOSIS — I10 Essential (primary) hypertension: Secondary | ICD-10-CM | POA: Diagnosis not present

## 2017-10-20 DIAGNOSIS — D487 Neoplasm of uncertain behavior of other specified sites: Secondary | ICD-10-CM | POA: Diagnosis not present

## 2017-10-20 DIAGNOSIS — K219 Gastro-esophageal reflux disease without esophagitis: Secondary | ICD-10-CM

## 2017-10-20 DIAGNOSIS — Z1231 Encounter for screening mammogram for malignant neoplasm of breast: Secondary | ICD-10-CM

## 2017-10-20 DIAGNOSIS — N611 Abscess of the breast and nipple: Secondary | ICD-10-CM

## 2017-10-20 DIAGNOSIS — Z1239 Encounter for other screening for malignant neoplasm of breast: Secondary | ICD-10-CM

## 2017-10-20 MED ORDER — OMEPRAZOLE 40 MG PO CPDR
40.0000 mg | DELAYED_RELEASE_CAPSULE | Freq: Every day | ORAL | 5 refills | Status: DC
Start: 2017-10-20 — End: 2018-07-11

## 2017-10-20 MED ORDER — DOXYCYCLINE HYCLATE 100 MG PO CAPS: 100.0000 mg | ORAL_CAPSULE | Freq: Two times a day (BID) | ORAL | 3 refills | Status: DC

## 2017-10-20 NOTE — Progress Notes (Signed)
[facility] [place], [place] [address]  Internal MEDICINE  Office Visit Note  Patient Name: Taylor Morris  [id number]  [id number]  Date of Service: 10/20/2017   Pt is here for routine health maintenance examination   Chief Complaint  Patient presents with  . Hypertension  . Annual Exam  . Skin Discoloration    left side of face at her hairline  . Insomnia     The patient states that she recently had infection in her breast. Had discharge and oozing from the nipple. Got very sore and tender. Has gradually gotten better. Drainage has been scabbing over. This has been recurrent issue for some time. Did see surgeon for this. Biopsy was negative for malignancy. It is time for her to have screening mammogram.  Has area of hyperpigmentation on the left side of her face. Has noticed this area getting bigger in photographs. Does not itch or hurt. Also has a mole on her back which as gotten larger. Both need to be looked at by a dermatologist. The patient states that she is also having trouble sleeping. Falling asleep is fine. Has no trouble with this. Wakes up around 345 am and is unable to get back to sleep. She does have rx for clonazepam. Had forgotten she had this. Past two nights, she has tried taking this and has done much better for sleep.    Current Medication: Outpatient Encounter Medications as of 10/20/2017  Medication Sig  . clonazePAM (KLONOPIN) 0.5 MG tablet Take 1 tablet (0.5 mg total) by mouth daily as needed for anxiety.  . fluticasone (FLONASE) 50 MCG/ACT nasal spray Place 2 sprays into both nostrils daily.  Marland Kitchen ibuprofen (ADVIL,MOTRIN) 200 MG tablet Take by mouth.  Marland Kitchen lisinopril (PRINIVIL,ZESTRIL) 5 MG tablet Take 1 tablet (5 mg total) by mouth daily.  . meclizine (ANTIVERT) 25 MG tablet Take 1 tablet (25 mg total) by mouth 3 (three) times daily as needed for dizziness.  . doxycycline (VIBRAMYCIN) 100 MG capsule Take 1 capsule (100 mg total) by  mouth 2 (two) times daily.  Marland Kitchen omeprazole (PRILOSEC) 40 MG capsule Take 1 capsule (40 mg total) by mouth daily.  . [DISCONTINUED] doxycycline (VIBRAMYCIN) 100 MG capsule Take 1 capsule (100 mg total) by mouth 2 (two) times daily. (Patient not taking: Reported on 10/20/2017)  . [DISCONTINUED] meclizine (ANTIVERT) 25 MG tablet Take 1 tablet (25 mg total) by mouth 3 (three) times daily as needed. (Patient not taking: Reported on 07/10/2017)   No facility-administered encounter medications on file as of 10/20/2017.     Surgical History: Past Surgical History:  Procedure Laterality Date  . ABDOMINAL HYSTERECTOMY    . BREAST BIOPSY Right 07/2014   surgery to remove abcess 3:00/ Dr Marina Gravel  . BREAST DUCTAL SYSTEM EXCISION Right 07/22/2014   Procedure: right breast major duct excision ;  Surgeon: Sherri Rad, MD;  Location: [facility] ORS;  Service: General;  Laterality: Right;  . CESAREAN SECTION     times 2  . CHOLECYSTECTOMY    . INCISION AND DRAINAGE BREAST ABSCESS Right 05/16  . TUBAL LIGATION      Medical History: Past Medical History:  Diagnosis Date  . Anxiety   . Arthritis   . Complication of anesthesia   . H/O cesarean section    times 2  . PONV (postoperative nausea and vomiting)     Family History: Family History  Problem Relation Age of Onset  . Breast cancer Maternal Aunt 68  Review of Systems  Constitutional: Negative for activity change, chills, fatigue and unexpected weight change.  HENT: Negative for congestion, postnasal drip, rhinorrhea, sneezing and sore throat.   Eyes: Negative.  Negative for redness.  Respiratory: Negative for cough, chest tightness, shortness of breath and wheezing.   Cardiovascular: Negative for chest pain and palpitations.  Gastrointestinal: Negative for abdominal pain, constipation, diarrhea, nausea and vomiting.  Endocrine: Negative for cold intolerance, heat intolerance, polydipsia, polyphagia and polyuria.  Genitourinary: Negative for dysuria  and frequency.  Musculoskeletal: Positive for back pain and neck pain. Negative for arthralgias and joint swelling.  Skin: Negative for rash.       Localized infection of skin of right breast. Causes tenderness, redness and drainage from the lesion and from the nipple. Small area of darkened skin near the outer left eye. Patient feels this area is getting larger. No pain or itching associated with it.  Several small moles on her back.   Allergic/Immunologic: Negative for environmental allergies.  Neurological: Positive for headaches. Negative for dizziness, tremors, weakness and numbness.  Hematological: Negative for adenopathy. Does not bruise/bleed easily.  Psychiatric/Behavioral: Positive for sleep disturbance. Negative for behavioral problems (Depression) and suicidal ideas. The patient is nervous/anxious.     Today's Vitals   10/20/17 0856  BP: 140/84  Pulse: 75  Resp: 16  SpO2: 97%  Weight: 175 lb 6.4 oz (79.6 kg)  Height: 5\' 3"  (1.6 m)   Physical Exam  Constitutional: She is oriented to person, place, and time. She appears well-developed and well-nourished. No distress.  HENT:  Head: Normocephalic and atraumatic.  Nose: Nose normal.  Mouth/Throat: Oropharynx is clear and moist. No oropharyngeal exudate.  Eyes: Pupils are equal, round, and reactive to light. Conjunctivae and EOM are normal.  Neck: Normal range of motion. Neck supple. No JVD present. Carotid bruit is not present. No tracheal deviation present. No thyromegaly present.  Cardiovascular: Normal rate, regular rhythm, normal heart sounds and intact distal pulses. Exam reveals no gallop and no friction rub.  No murmur heard. Pulmonary/Chest: Effort normal and breath sounds normal. No respiratory distress. She has no wheezes. She has no rales. She exhibits no tenderness. Right breast exhibits inverted nipple, nipple discharge, skin change and tenderness. Left breast exhibits no inverted nipple, no nipple discharge, no  skin change and no tenderness.    Abdominal: Soft. Bowel sounds are normal. There is no tenderness.  Musculoskeletal: Normal range of motion.  Lymphadenopathy:    She has no cervical adenopathy.  Neurological: She is alert and oriented to person, place, and time. No cranial nerve deficit.  Skin: Skin is warm and dry. Capillary refill takes less than 2 seconds. She is not diaphoretic.  There is small area of hyperpigmentation adjacent to the lateral aspect of left eye. It is flush with surrounding skin. There is no roughness or flaking of the skin. Area measures approximately 2cm by 1cm.  She has several, small, benign appearing moles on the upper right aspect of her back.   Psychiatric: She has a normal mood and affect. Her behavior is normal. Judgment and thought content normal.  Nursing note and vitals reviewed.    LABS: Recent Results (from the past 2160 hour(s))  I-STAT creatinine     Status: None   Collection Time: 07/22/17  3:00 PM  Result Value Ref Range   Creatinine, Ser 0.80 0.44 - 1.00 mg/dL    Assessment/Plan: 1. Encounter for general adult medical examination with abnormal findings Annual health maintenance exam.  -  UA/M w/rflx Culture, Routine  2. 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 Will start doxycycline 100mg  daily to prevent further infection. Mammogram ordered for breast cancer screening.  - doxycycline (VIBRAMYCIN) 100 MG capsule; Take 1 capsule (100 mg total) by mouth 2 (two) times daily.  Dispense: 30 capsule; Refill: 3  3. Essential hypertension Stable. continue bp medication as prescribed.   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 Gastroesophageal reflux disease without esophagitis - omeprazole (PRILOSEC) 40 MG capsule; Take 1 capsule (40 mg total) by mouth daily.  Dispense: 30 capsule; Refill: 5  6. Screening for breast cancer - MM DIGITAL SCREENING BILATERAL; Future  General  Counseling: lorenza shakir understanding of the findings of todays visit and agrees with plan of treatment. I have discussed any further diagnostic evaluation that may be needed or ordered today. We also reviewed her medications today. she has been encouraged to call the office with any questions or concerns that should arise related to todays visit.    Counseling:   This patient was seen by Leretha Pol FNP Collaboration with Dr Lavera Guise as a part of collaborative care agreement  Orders Placed This Encounter  Procedures  . MM DIGITAL SCREENING BILATERAL  . UA/M w/rflx Culture, Routine  . Ambulatory referral to Dermatology    Meds ordered this encounter  Medications  . doxycycline (VIBRAMYCIN) 100 MG capsule    Sig: Take 1 capsule (100 mg total) by mouth 2 (two) times daily.    Dispense:  30 capsule    Refill:  3    Take 1 capsule po QD to prevent infectin.    Order Specific Question:   Supervising Provider    Answer:   Lavera Guise [[id number]]  . omeprazole (PRILOSEC) 40 MG capsule    Sig: Take 1 capsule (40 mg total) by mouth daily.    Dispense:  30 capsule    Refill:  5    Order Specific Question:   Supervising Provider    Answer:   Lavera Guise [[id number]]    Time spent:30 [place], MD  Internal Medicine

## 2017-10-20 NOTE — Addendum Note (Signed)
Addended by: Leretha Pol on: 10/20/2017 01:32 PM   Modules accepted: Orders

## 2017-12-01 ENCOUNTER — Ambulatory Visit
Admission: RE | Admit: 2017-12-01 | Discharge: 2017-12-01 | Disposition: A | Discharge status: Home / Self Care | Payer: Medicaid Other | Source: Ambulatory Visit | Attending: Nurse Practitioner | Admitting: Nurse Practitioner

## 2017-12-01 ENCOUNTER — Other Ambulatory Visit: Payer: Self-pay | Admitting: Nurse Practitioner

## 2017-12-01 DIAGNOSIS — Z1231 Encounter for screening mammogram for malignant neoplasm of breast: Secondary | ICD-10-CM | POA: Insufficient documentation

## 2017-12-01 DIAGNOSIS — N6310 Unspecified lump in the right breast, unspecified quadrant: Secondary | ICD-10-CM

## 2017-12-01 DIAGNOSIS — Z1239 Encounter for other screening for malignant neoplasm of breast: Secondary | ICD-10-CM

## 2017-12-20 ENCOUNTER — Ambulatory Visit
Admission: RE | Admit: 2017-12-20 | Discharge: 2017-12-20 | Disposition: A | Discharge status: Home / Self Care | Payer: Medicaid Other | Source: Ambulatory Visit | Attending: Nurse Practitioner | Admitting: Nurse Practitioner

## 2017-12-20 DIAGNOSIS — N6310 Unspecified lump in the right breast, unspecified quadrant: Secondary | ICD-10-CM

## 2017-12-20 DIAGNOSIS — N6315 Unspecified lump in the right breast, overlapping quadrants: Secondary | ICD-10-CM | POA: Diagnosis not present

## 2018-02-23 ENCOUNTER — Ambulatory Visit: Payer: Self-pay | Admitting: Nurse Practitioner

## 2018-03-05 ENCOUNTER — Telehealth: Payer: Self-pay | Admitting: Nurse Practitioner

## 2018-03-05 ENCOUNTER — Encounter: Payer: Self-pay | Admitting: Nurse Practitioner

## 2018-03-05 ENCOUNTER — Ambulatory Visit: Payer: Medicaid Other | Admitting: Nurse Practitioner

## 2018-03-05 VITALS — BP 164/89 | HR 73 | Resp 16 | Ht 62.0 in | Wt 176.8 lb

## 2018-03-05 DIAGNOSIS — R51 Headache: Secondary | ICD-10-CM

## 2018-03-05 DIAGNOSIS — H539 Unspecified visual disturbance: Secondary | ICD-10-CM | POA: Insufficient documentation

## 2018-03-05 DIAGNOSIS — I10 Essential (primary) hypertension: Secondary | ICD-10-CM | POA: Diagnosis not present

## 2018-03-05 DIAGNOSIS — F411 Generalized anxiety disorder: Secondary | ICD-10-CM | POA: Diagnosis not present

## 2018-03-05 DIAGNOSIS — R519 Headache, unspecified: Secondary | ICD-10-CM

## 2018-03-05 MED ORDER — CLONAZEPAM 0.5 MG PO TABS: 0.5000 mg | ORAL_TABLET | Freq: Every day | ORAL | 3 refills | Status: DC | PRN

## 2018-03-05 MED ORDER — LISINOPRIL 10 MG PO TABS: 5.0000 mg | ORAL_TABLET | Freq: Every day | ORAL | 3 refills | Status: DC

## 2018-03-05 NOTE — Progress Notes (Signed)
[facility] [place], [place] [address]  Internal MEDICINE  Office Visit Note  Patient Name: Taylor Morris  [id number]  [id number]  Date of Service: 03/05/2018  Chief Complaint  Patient presents with  . Medical Management of Chronic Issues    4 month follow up, patient blood pressure was 203/180, pt believes she may have had a stroke, the headache stayed for a wk the vision in the left eye was still messed up, left shoulder was also hurting. she went to urgent care.   . Quality Metric Gaps    pt does not plan to get the flu vaccine    The patient is here for routine follow up visit. States that last Monday, she had some left shoulder pain and left sided headache. Headache started to mess with her vision. Started making her nauseated. Never had a migraine in the past. Felt like she was going to have some vertigo. Felt like she caused herself to have a panic attack. By the time she got to work, she had numbness in her hands and fingers. Her blood pressure was very elevated. Was taken to the walk in clinic. She stayed at urgent care until her blood pressure was down to a point where she could be safely discharged. They prescribed no medications. Advised her to go to ER if symptoms started again. The next day, she felt better. Headache lasted until Saturday. Blood pressure is still elevated. She does not have a headache. Blood pressure is elevated today. Currently taking lisinopril 5mg . Does not take every night, but did take it last night.       Current Medication: Outpatient Encounter Medications as of 03/05/2018  Medication Sig  . clonazePAM (KLONOPIN) 0.5 MG tablet Take 1 tablet (0.5 mg total) by mouth daily as needed for anxiety.  . fluticasone (FLONASE) 50 MCG/ACT nasal spray Place 2 sprays into both nostrils daily.  Marland Kitchen ibuprofen (ADVIL,MOTRIN) 200 MG tablet Take by mouth.  Marland Kitchen lisinopril (PRINIVIL,ZESTRIL) 10 MG tablet Take 0.5 tablets (5 mg total) by mouth  daily.  . meclizine (ANTIVERT) 25 MG tablet Take 1 tablet (25 mg total) by mouth 3 (three) times daily as needed for dizziness.  Marland Kitchen omeprazole (PRILOSEC) 40 MG capsule Take 1 capsule (40 mg total) by mouth daily.  . [DISCONTINUED] clonazePAM (KLONOPIN) 0.5 MG tablet Take 1 tablet (0.5 mg total) by mouth daily as needed for anxiety.  . [DISCONTINUED] lisinopril (PRINIVIL,ZESTRIL) 5 MG tablet Take 1 tablet (5 mg total) by mouth daily.  Marland Kitchen doxycycline (VIBRAMYCIN) 100 MG capsule Take 1 capsule (100 mg total) by mouth 2 (two) times daily.   No facility-administered encounter medications on file as of 03/05/2018.     Surgical History: Past Surgical History:  Procedure Laterality Date  . ABDOMINAL HYSTERECTOMY    . BREAST BIOPSY Right 07/2014   surgery to remove abcess 3:00/ Dr Marina Gravel  . BREAST DUCTAL SYSTEM EXCISION Right 07/22/2014   Procedure: right breast major duct excision ;  Surgeon: Sherri Rad, MD;  Location: [facility] ORS;  Service: General;  Laterality: Right;  . CESAREAN SECTION     times 2  . CHOLECYSTECTOMY    . INCISION AND DRAINAGE BREAST ABSCESS Right 05/16  . TUBAL LIGATION      Medical History: Past Medical History:  Diagnosis Date  . Anxiety   . Arthritis   . Complication of anesthesia   . H/O cesarean section    times 2  . PONV (postoperative nausea and vomiting)  Family History: Family History  Problem Relation Age of Onset  . Breast cancer Maternal Aunt 68    Social History   Socioeconomic History  . Marital status: Widowed    Spouse name: Not on file  . Number of children: Not on file  . Years of education: Not on file  . Highest education level: Not on file  Occupational History  . Not on file  Social Needs  . Financial resource strain: Not on file  . Food insecurity:    Worry: Not on file    Inability: Not on file  . Transportation needs:    Medical: Not on file    Non-medical: Not on file  Tobacco Use  . Smoking status: Current Some Day Smoker      Packs/day: 0.25    Years: 30.00    Pack years: 7.50  . Smokeless tobacco: Current User  Substance and Sexual Activity  . Alcohol use: No  . Drug use: No  . Sexual activity: Not on file  Lifestyle  . Physical activity:    Days per week: Not on file    Minutes per session: Not on file  . Stress: Not on file  Relationships  . Social connections:    Talks on phone: Not on file    Gets together: Not on file    Attends religious service: Not on file    Active member of club or organization: Not on file    Attends meetings of clubs or organizations: Not on file    Relationship status: Not on file  . Intimate partner violence:    Fear of current or ex partner: Not on file    Emotionally abused: Not on file    Physically abused: Not on file    Forced sexual activity: Not on file  Other Topics Concern  . Not on file  Social History Narrative  . Not on file      Review of Systems  Constitutional: Negative for activity change, chills, fatigue and unexpected weight change.  HENT: Negative for congestion, postnasal drip, rhinorrhea, sneezing and sore throat.   Eyes: Positive for visual disturbance. Negative for redness.       Vision in left eye still slightly off.   Respiratory: Negative for cough, chest tightness, shortness of breath and wheezing.   Cardiovascular: Negative for chest pain and palpitations.       Elevated blood pressure today.   Gastrointestinal: Negative for abdominal pain, constipation, diarrhea, nausea and vomiting.  Endocrine: Negative for cold intolerance, heat intolerance, polydipsia and polyuria.  Musculoskeletal: Positive for back pain. Negative for arthralgias, joint swelling and neck pain.  Skin: Negative for rash.  Allergic/Immunologic: Negative for environmental allergies.  Neurological: Positive for dizziness and headaches. Negative for tremors and numbness.  Hematological: Negative for adenopathy. Does not bruise/bleed easily.   Psychiatric/Behavioral: Negative for behavioral problems (Depression), sleep disturbance and suicidal ideas. The patient is nervous/anxious.        Panic attacks, intermittently.     Vital Signs: BP (!) 164/89 (BP Location: Left Arm, Patient Position: Sitting, Cuff Size: Large)   Pulse 73   Resp 16   Ht 5\' 2"  (1.575 m)   Wt 176 lb 12.8 oz (80.2 kg)   SpO2 98%   BMI 32.34 kg/m    Physical Exam Vitals signs and nursing note reviewed.  Constitutional:      General: She is not in acute distress.    Appearance: Normal appearance. She is well-developed. She is  not diaphoretic.  HENT:     Head: Normocephalic and atraumatic.     Nose: Nose normal.     Mouth/Throat:     Pharynx: No oropharyngeal exudate.  Eyes:     Conjunctiva/sclera: Conjunctivae normal.     Pupils: Pupils are equal, round, and reactive to light.   Neck:     Musculoskeletal: Normal range of motion and neck supple.     Thyroid: No thyromegaly.     Vascular: No JVD.     Trachea: No tracheal deviation.  Cardiovascular:     Rate and Rhythm: Normal rate and regular rhythm.     Heart sounds: Normal heart sounds. No murmur. No friction rub. No gallop.   Pulmonary:     Effort: Pulmonary effort is normal. No respiratory distress.     Breath sounds: Normal breath sounds. No wheezing or rales.  Chest:     Chest wall: No tenderness.  Abdominal:     General: Bowel sounds are normal.     Palpations: Abdomen is soft.  Musculoskeletal: Normal range of motion.  Lymphadenopathy:     Cervical: No cervical adenopathy.  Skin:    General: Skin is warm and dry.  Neurological:     General: No focal deficit present.     Mental Status: She is alert and oriented to person, place, and time.     Cranial Nerves: No cranial nerve deficit.     Sensory: No sensory deficit.     Motor: No weakness.     Coordination: Coordination normal.     Gait: Gait normal.  Psychiatric:        Attention and Perception: Attention and perception  normal.        Mood and Affect: Mood is anxious.        Speech: Speech normal.        Behavior: Behavior normal.        Thought Content: Thought content normal.        Cognition and Memory: Cognition normal.        Judgment: Judgment normal.    Assessment/Plan: 1. Visual disturbance of one eye Will get head CT. Refer to ophthalmology for further evaluation and treatment.  - CT Head Wo Contrast; Future - Ambulatory referral to Ophthalmology  2. Acute nonintractable headache, unspecified headache type Will get head CT. Refer to ophthalmology for further evaluation and treatment.  - CT Head Wo Contrast; Future - Ambulatory referral to Ophthalmology  3. Essential hypertension Increase lisinopril to 10mg  daily. Reassess at follow up visit - lisinopril (PRINIVIL,ZESTRIL) 10 MG tablet; Take 0.5 tablets (5 mg total) by mouth daily.  Dispense: 30 tablet; Refill: 3  4. Generalized anxiety disorder May continue clonazepam 0.5mg  daily if needed. Refill sent to her pharmacy today.  - clonazePAM (KLONOPIN) 0.5 MG tablet; Take 1 tablet (0.5 mg total) by mouth daily as needed for anxiety.  Dispense: 30 tablet; Refill: 3  General Counseling: Darius verbalizes understanding of the findings of todays visit and agrees with plan of treatment. I have discussed any further diagnostic evaluation that may be needed or ordered today. We also reviewed her medications today. she has been encouraged to call the office with any questions or concerns that should arise related to todays visit.   Hypertension Counseling:   The following hypertensive lifestyle modification were recommended and discussed:  1. Limiting alcohol intake to less than 1 oz/day of ethanol:(24 oz of beer or 8 oz of wine or 2 oz of 100-proof whiskey). 2.  Take baby ASA 81 mg daily. 3. Importance of regular aerobic exercise and losing weight. 4. Reduce dietary saturated fat and cholesterol intake for overall cardiovascular health. 5.  Maintaining adequate dietary potassium, calcium, and magnesium intake. 6. Regular monitoring of the blood pressure. 7. Reduce sodium intake to less than 100 mmol/day (less than 2.3 gm of sodium or less than 6 gm of sodium choride)   This patient was seen by [place] with Dr Lavera Guise as a part of collaborative care agreement  Orders Placed This Encounter  Procedures  . CT Head Wo Contrast  . Ambulatory referral to Ophthalmology    Meds ordered this encounter  Medications  . lisinopril (PRINIVIL,ZESTRIL) 10 MG tablet    Sig: Take 0.5 tablets (5 mg total) by mouth daily.    Dispense:  30 tablet    Refill:  3    Please note increased dose    Order Specific Question:   Supervising Provider    Answer:   Lavera Guise [[id number]]  . clonazePAM (KLONOPIN) 0.5 MG tablet    Sig: Take 1 tablet (0.5 mg total) by mouth daily as needed for anxiety.    Dispense:  30 tablet    Refill:  3    Order Specific Question:   Supervising Provider    Answer:   Lavera Guise [[id number]]    Time spent: 48 Minutes      Dr Lavera Guise Internal medicine

## 2018-03-05 NOTE — Telephone Encounter (Signed)
Made appointment for [facility] for Jan. 10th at 10:30 am.jw

## 2018-03-27 ENCOUNTER — Ambulatory Visit
Admission: RE | Admit: 2018-03-27 | Discharge: 2018-03-27 | Disposition: A | Discharge status: Home / Self Care | Payer: Medicaid Other | Source: Ambulatory Visit | Attending: Nurse Practitioner | Admitting: Nurse Practitioner

## 2018-03-27 ENCOUNTER — Ambulatory Visit: Payer: Medicaid Other

## 2018-03-27 DIAGNOSIS — H539 Unspecified visual disturbance: Secondary | ICD-10-CM | POA: Diagnosis present

## 2018-03-27 DIAGNOSIS — R51 Headache: Secondary | ICD-10-CM | POA: Insufficient documentation

## 2018-03-27 DIAGNOSIS — R519 Headache, unspecified: Secondary | ICD-10-CM

## 2018-03-29 ENCOUNTER — Ambulatory Visit: Payer: Self-pay | Admitting: Nurse Practitioner

## 2018-04-10 ENCOUNTER — Ambulatory Visit: Payer: Medicaid Other | Admitting: Nurse Practitioner

## 2018-04-10 ENCOUNTER — Encounter: Payer: Self-pay | Admitting: Nurse Practitioner

## 2018-04-10 VITALS — BP 152/87 | HR 80 | Temp 98.3°F | Resp 16 | Ht 62.0 in | Wt 175.2 lb

## 2018-04-10 DIAGNOSIS — I10 Essential (primary) hypertension: Secondary | ICD-10-CM | POA: Diagnosis not present

## 2018-04-10 DIAGNOSIS — R519 Headache, unspecified: Secondary | ICD-10-CM

## 2018-04-10 DIAGNOSIS — J323 Chronic sphenoidal sinusitis: Secondary | ICD-10-CM

## 2018-04-10 DIAGNOSIS — R51 Headache: Secondary | ICD-10-CM | POA: Diagnosis not present

## 2018-04-10 MED ORDER — SULFAMETHOXAZOLE-TRIMETHOPRIM 800-160 MG PO TABS: 1.0000 | ORAL_TABLET | Freq: Two times a day (BID) | ORAL | 0 refills | Status: DC

## 2018-04-10 MED ORDER — FLUTICASONE PROPIONATE 50 MCG/ACT NA SUSP: 2.0000 | Freq: Every day | NASAL | 6 refills | Status: DC

## 2018-04-10 NOTE — Progress Notes (Signed)
[facility] [place], [place] [address]  Internal MEDICINE  Office Visit Note  Patient Name: Taylor Morris  [id number]  [id number]  Date of Service: 04/18/2018   Pt is here for a sick visit.  Chief Complaint  Patient presents with  . Headache    only on the left side, eyey sight blurry  . Sinusitis    possible sinis infection, seen some red when blowing nose, pressure in the head, no fever or chills  . Ear Problem    inside left ear itching badly, started about a day or so ago     Patient has left sided sinus headache. Hurts behind the left eye and into the head. Left ear is itchy and sore. Left side of neck is tender. Hurts when she bends her head forward. Has a great deal of nasal congestion but cannot get anything out when she blows her nose. Had similar symptoms a few months ago. CT scan of her head was done showing chronic sphenoid sinusitis. She was given doxycycline, but symptome have come right back. She denies fever, chills, or body aches. Does have some chest congestion but no cough except with nasal drainage.        Current Medication:  Outpatient Encounter Medications as of 04/10/2018  Medication Sig  . clonazePAM (KLONOPIN) 0.5 MG tablet Take 1 tablet (0.5 mg total) by mouth daily as needed for anxiety.  . fluticasone (FLONASE) 50 MCG/ACT nasal spray Place 2 sprays into both nostrils daily.  Marland Kitchen ibuprofen (ADVIL,MOTRIN) 200 MG tablet Take by mouth.  Marland Kitchen lisinopril (PRINIVIL,ZESTRIL) 10 MG tablet Take 0.5 tablets (5 mg total) by mouth daily.  . meclizine (ANTIVERT) 25 MG tablet Take 1 tablet (25 mg total) by mouth 3 (three) times daily as needed for dizziness.  Marland Kitchen omeprazole (PRILOSEC) 40 MG capsule Take 1 capsule (40 mg total) by mouth daily.  . [DISCONTINUED] doxycycline (VIBRAMYCIN) 100 MG capsule Take 1 capsule (100 mg total) by mouth 2 (two) times daily.  . [DISCONTINUED] fluticasone (FLONASE) 50 MCG/ACT nasal spray Place 2 sprays into  both nostrils daily.  Marland Kitchen sulfamethoxazole-trimethoprim (BACTRIM DS,SEPTRA DS) 800-160 MG tablet Take 1 tablet by mouth 2 (two) times daily.   No facility-administered encounter medications on file as of 04/10/2018.       Medical History: Past Medical History:  Diagnosis Date  . Anxiety   . Arthritis   . Complication of anesthesia   . H/O cesarean section    times 2  . PONV (postoperative nausea and vomiting)      Today's Vitals   04/10/18 1434  BP: (!) 152/87  Pulse: 80  Resp: 16  Temp: 98.3 F (36.8 C)  SpO2: 99%  Weight: 175 lb 3.2 oz (79.5 kg)  Height: 5\' 2"  (1.575 m)   Body mass index is 32.04 kg/m.  Review of Systems  Constitutional: Positive for fatigue. Negative for activity change, chills and unexpected weight change.  HENT: Positive for congestion, postnasal drip, rhinorrhea, sinus pressure and sinus pain. Negative for sneezing and sore throat.   Eyes: Negative for redness.       Marland Kitchen   Respiratory: Negative for cough, chest tightness, shortness of breath and wheezing.   Cardiovascular: Negative for chest pain and palpitations.       Elevated blood pressure today.   Gastrointestinal: Negative for abdominal pain, constipation, diarrhea, nausea and vomiting.  Endocrine: Negative for cold intolerance, heat intolerance, polydipsia and polyuria.  Musculoskeletal: Negative for arthralgias, back pain,  joint swelling and neck pain.  Skin: Negative for rash.  Allergic/Immunologic: Positive for environmental allergies.  Neurological: Positive for dizziness and headaches. Negative for tremors and numbness.  Hematological: Negative for adenopathy. Does not bruise/bleed easily.  Psychiatric/Behavioral: Negative for behavioral problems (Depression), sleep disturbance and suicidal ideas. The patient is nervous/anxious.        Panic attacks, intermittently.     Physical Exam Vitals signs and nursing note reviewed.  Constitutional:      General: She is not in acute  distress.    Appearance: She is well-developed. She is ill-appearing. She is not diaphoretic.  HENT:     Head: Normocephalic and atraumatic.     Right Ear: Tympanic membrane is bulging.     Left Ear: Tympanic membrane is bulging.     Nose: Congestion present.     Right Sinus: Maxillary sinus tenderness present.     Left Sinus: Maxillary sinus tenderness present.     Mouth/Throat:     Pharynx: Posterior oropharyngeal erythema present. No oropharyngeal exudate.  Eyes:     Pupils: Pupils are equal, round, and reactive to light.  Neck:     Musculoskeletal: Normal range of motion and neck supple.     Thyroid: No thyromegaly.     Vascular: No JVD.     Trachea: No tracheal deviation.  Cardiovascular:     Rate and Rhythm: Normal rate and regular rhythm.     Heart sounds: Normal heart sounds. No murmur. No friction rub. No gallop.   Pulmonary:     Effort: Pulmonary effort is normal. No respiratory distress.     Breath sounds: Normal breath sounds. No wheezing or rales.  Chest:     Chest wall: No tenderness.  Abdominal:     General: Bowel sounds are normal.     Palpations: Abdomen is soft.  Musculoskeletal: Normal range of motion.  Lymphadenopathy:     Cervical: Cervical adenopathy present.  Skin:    General: Skin is warm and dry.  Neurological:     Mental Status: She is alert and oriented to person, place, and time.     Cranial Nerves: No cranial nerve deficit.  Psychiatric:        Behavior: Behavior normal.        Thought Content: Thought content normal.        Judgment: Judgment normal.    Assessment/Plan:  1. Chronic sphenoidal sinusitis Start bactrim DS bid for 14 days. Use flonase nasal spray, two sprays on both nostrils daily. Rest and increase fluids. Recommend OTC medications to alleviate symptoms. - sulfamethoxazole-trimethoprim (BACTRIM DS,SEPTRA DS) 800-160 MG tablet; Take 1 tablet by mouth 2 (two) times daily.  Dispense: 28 tablet; Refill: 0 - fluticasone (FLONASE)  50 MCG/ACT nasal spray; Place 2 sprays into both nostrils daily.  Dispense: 16 g; Refill: 6  2. Acute nonintractable headache, unspecified headache type Likely from sinusitis. Antibiotics as prescribed. OTC tylenol and NSAIDs as needed and as indicated to relieve pain.   3. Essential hypertension Lisinopril as prescribed   General Counseling: Davelyn verbalizes understanding of the findings of todays visit and agrees with plan of treatment. I have discussed any further diagnostic evaluation that may be needed or ordered today. We also reviewed her medications today. she has been encouraged to call the office with any questions or concerns that should arise related to todays visit.    Counseling:  Rest and increase fluids. Continue using OTC medication to control symptoms.   This patient was seen  by Leretha Pol FNP Collaboration with Dr Lavera Guise as a part of collaborative care agreement  Meds ordered this encounter  Medications  . sulfamethoxazole-trimethoprim (BACTRIM DS,SEPTRA DS) 800-160 MG tablet    Sig: Take 1 tablet by mouth 2 (two) times daily.    Dispense:  28 tablet    Refill:  0    Order Specific Question:   Supervising Provider    Answer:   Lavera Guise [[id number]]  . fluticasone (FLONASE) 50 MCG/ACT nasal spray    Sig: Place 2 sprays into both nostrils daily.    Dispense:  16 g    Refill:  6    Order Specific Question:   Supervising Provider    Answer:   Lavera Guise [[id number]]    Time spent: 25 Minutes

## 2018-04-30 ENCOUNTER — Other Ambulatory Visit: Payer: Self-pay

## 2018-04-30 ENCOUNTER — Encounter: Payer: Self-pay | Admitting: Emergency Medicine

## 2018-04-30 ENCOUNTER — Emergency Department
Admission: EM | Admit: 2018-04-30 | Discharge: 2018-04-30 | Disposition: A | Discharge status: Home / Self Care | Payer: Medicaid Other | Attending: Emergency Medicine | Admitting: Emergency Medicine

## 2018-04-30 ENCOUNTER — Emergency Department: Payer: Medicaid Other

## 2018-04-30 DIAGNOSIS — I10 Essential (primary) hypertension: Secondary | ICD-10-CM | POA: Diagnosis not present

## 2018-04-30 DIAGNOSIS — F17210 Nicotine dependence, cigarettes, uncomplicated: Secondary | ICD-10-CM | POA: Diagnosis not present

## 2018-04-30 DIAGNOSIS — Z79899 Other long term (current) drug therapy: Secondary | ICD-10-CM | POA: Diagnosis not present

## 2018-04-30 DIAGNOSIS — R51 Headache: Secondary | ICD-10-CM | POA: Insufficient documentation

## 2018-04-30 DIAGNOSIS — R1012 Left upper quadrant pain: Secondary | ICD-10-CM | POA: Insufficient documentation

## 2018-04-30 DIAGNOSIS — K219 Gastro-esophageal reflux disease without esophagitis: Secondary | ICD-10-CM | POA: Diagnosis not present

## 2018-04-30 DIAGNOSIS — R0789 Other chest pain: Secondary | ICD-10-CM | POA: Diagnosis present

## 2018-04-30 DIAGNOSIS — F419 Anxiety disorder, unspecified: Secondary | ICD-10-CM | POA: Diagnosis not present

## 2018-04-30 LAB — COMPREHENSIVE METABOLIC PANEL
ALT: 10 U/L (ref 0–44)
AST: 14 U/L — ABNORMAL LOW (ref 15–41)
Albumin: 4 g/dL (ref 3.5–5.0)
Alkaline Phosphatase: 65 U/L (ref 38–126)
Anion gap: 5 (ref 5–15)
BUN: 11 mg/dL (ref 6–20)
CO2: 27 mmol/L (ref 22–32)
CREATININE: 0.72 mg/dL (ref 0.44–1.00)
Calcium: 9 mg/dL (ref 8.9–10.3)
Chloride: 106 mmol/L (ref 98–111)
GFR calc Af Amer: >60 mL/min (ref >60)
Glucose, Bld: 97 mg/dL (ref 70–99)
Potassium: 3.8 mmol/L (ref 3.5–5.1)
Sodium: 138 mmol/L (ref 135–145)
Total Bilirubin: 0.5 mg/dL (ref 0.3–1.2)
Total Protein: 7.1 g/dL (ref 6.5–8.1)

## 2018-04-30 LAB — TROPONIN I
Troponin I: <0.03 ng/mL (ref <0.03)
Troponin I: <0.03 ng/mL (ref <0.03)

## 2018-04-30 LAB — LIPASE, BLOOD: Lipase: 32 U/L (ref 11–51)

## 2018-04-30 LAB — CBC WITH DIFFERENTIAL/PLATELET
Abs Immature Granulocytes: 0.01 10*3/uL (ref 0.00–0.07)
Basophils Absolute: 0 10*3/uL (ref 0.0–0.1)
Basophils Relative: 0 %
Eosinophils Absolute: 0.1 10*3/uL (ref 0.0–0.5)
Eosinophils Relative: 1 %
HCT: 43.7 % (ref 36.0–46.0)
HEMOGLOBIN: 14.4 g/dL (ref 12.0–15.0)
Immature Granulocytes: 0 %
Lymphocytes Relative: 44 %
Lymphs Abs: 3.3 10*3/uL (ref 0.7–4.0)
MCH: 30.4 pg (ref 26.0–34.0)
MCHC: 33 g/dL (ref 30.0–36.0)
MCV: 92.2 fL (ref 80.0–100.0)
Monocytes Absolute: 0.3 10*3/uL (ref 0.1–1.0)
Monocytes Relative: 4 %
Neutro Abs: 3.8 10*3/uL (ref 1.7–7.7)
Neutrophils Relative %: 51 %
Platelets: 230 10*3/uL (ref 150–400)
RBC: 4.74 MIL/uL (ref 3.87–5.11)
RDW: 13 % (ref 11.5–15.5)
WBC: 7.4 10*3/uL (ref 4.0–10.5)
nRBC: 0 % (ref 0.0–0.2)

## 2018-04-30 MED ORDER — ALUM & MAG HYDROXIDE-SIMETH 400-400-40 MG/5ML PO SUSP: 5.0000 mL | Freq: Four times a day (QID) | ORAL | 0 refills | Status: DC | PRN

## 2018-04-30 MED ORDER — FENTANYL CITRATE (PF) 100 MCG/2ML IJ SOLN
50.0000 ug | Freq: Once | INTRAMUSCULAR | Status: AC
  Administered 2018-04-30: 50 ug via INTRAVENOUS
  Filled 2018-04-30: qty 2

## 2018-04-30 MED ORDER — LIDOCAINE VISCOUS HCL 2 % MT SOLN
15.0000 mL | Freq: Once | OROMUCOSAL | Status: AC
  Administered 2018-04-30: 15 mL via ORAL
  Filled 2018-04-30: qty 15

## 2018-04-30 MED ORDER — ALUM & MAG HYDROXIDE-SIMETH 200-200-20 MG/5ML PO SUSP
30.0000 mL | Freq: Once | ORAL | Status: AC
  Administered 2018-04-30: 30 mL via ORAL
  Filled 2018-04-30: qty 30

## 2018-04-30 MED ORDER — ACETAMINOPHEN 500 MG PO TABS
1000.0000 mg | ORAL_TABLET | Freq: Once | ORAL | Status: AC
  Administered 2018-04-30: 1000 mg via ORAL
  Filled 2018-04-30: qty 2

## 2018-04-30 MED ORDER — ONDANSETRON HCL 4 MG/2ML IJ SOLN
4.0000 mg | Freq: Once | INTRAMUSCULAR | Status: AC
  Administered 2018-04-30: 4 mg via INTRAVENOUS
  Filled 2018-04-30: qty 2

## 2018-04-30 MED ORDER — SODIUM CHLORIDE 0.9% FLUSH
3.0000 mL | Freq: Once | INTRAVENOUS | Status: AC
  Administered 2018-04-30: 3 mL via INTRAVENOUS

## 2018-04-30 MED ORDER — ONDANSETRON 4 MG PO TBDP: 4.0000 mg | ORAL_TABLET | Freq: Three times a day (TID) | ORAL | 0 refills | Status: DC | PRN

## 2018-04-30 MED ORDER — FAMOTIDINE 40 MG PO TABS: 40.0000 mg | ORAL_TABLET | Freq: Every day | ORAL | 0 refills | Status: DC

## 2018-04-30 MED ORDER — FAMOTIDINE IN NACL 20-0.9 MG/50ML-% IV SOLN
20.0000 mg | Freq: Once | INTRAVENOUS | Status: AC
  Administered 2018-04-30: 20 mg via INTRAVENOUS
  Filled 2018-04-30: qty 50

## 2018-04-30 NOTE — Discharge Instructions (Addendum)

## 2018-04-30 NOTE — ED Notes (Signed)
ED Provider at bedside. 

## 2018-04-30 NOTE — ED Notes (Signed)
Patient to Rm 8, Dr. Alfred Levins reviewed EKG.  Annie Main RN aware of placement.

## 2018-04-30 NOTE — ED Notes (Signed)
Patient transported to X-ray 

## 2018-04-30 NOTE — ED Triage Notes (Signed)
Chest pain / burning sensation x3 days, radiating left neck / dizziness. Increased chest pressure today

## 2018-04-30 NOTE — ED Provider Notes (Signed)
[facility] Emergency Department Provider Note  ____________________________________________  Time seen: Approximately 12:52 PM  I have reviewed the triage vital signs and the nursing notes.   HISTORY  Chief Complaint Chest Pain   HPI Taylor Morris is a 53 y.o. female with a history of anxiety, arthritis, GERD, headaches, hypertension who presents for evaluation of chest pain.  Patient reports 3 days of constant epigastric and LUQ pressure like pain. She reports that it was similar to her prior reflux so she started taking omeprazole which had been prescribed by her primary care doctor.  She has nausea. This morning when she woke up the pain was persistent and she also had burning pain on the left shoulder which made her concerned for her heart.  Patient denies any personal or family history of heart attacks.  She is a smoker.  She has had cholecystectomy, tubal ligation, and hysterectomy in the past.  She is passing flatus, having normal bowel movements.  No prior history of SBO.  She has no shortness of breath, no cough, no fever or chills, no dysuria or hematuria.  She denies alcohol use, NSAID use, coffee-ground emesis, melena, hematemesis.   Past Medical History:  Diagnosis Date  . Anxiety   . Arthritis   . Complication of anesthesia   . H/O cesarean section    times 2  . PONV (postoperative nausea and vomiting)     Patient Active Problem List   Diagnosis Date Noted  . Visual disturbance of one eye 03/05/2018  . 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 Gastroesophageal reflux disease without esophagitis 10/20/2017  . Chronic sphenoidal sinusitis 08/23/2017  . Encounter for general adult medical examination with abnormal findings 08/23/2017  . Acute nonintractable headache 07/10/2017  . Other cervical disc degeneration, unspecified cervical region 07/10/2017  . Essential hypertension 07/10/2017  . Generalized anxiety disorder  07/10/2017    Past Surgical History:  Procedure Laterality Date  . ABDOMINAL HYSTERECTOMY    . BREAST BIOPSY Right 07/2014   surgery to remove abcess 3:00/ Dr Marina Gravel  . BREAST DUCTAL SYSTEM EXCISION Right 07/22/2014   Procedure: right breast major duct excision ;  Surgeon: Sherri Rad, MD;  Location: [facility] ORS;  Service: General;  Laterality: Right;  . CESAREAN SECTION     times 2  . CHOLECYSTECTOMY    . INCISION AND DRAINAGE BREAST ABSCESS Right 05/16  . TUBAL LIGATION      Prior to Admission medications   Medication Sig Start Date End Date Taking? Authorizing Provider  clonazePAM (KLONOPIN) 0.5 MG tablet Take 1 tablet (0.5 mg total) by mouth daily as needed for anxiety. 03/05/18  Yes Boscia, Greer Ee, NP  fluticasone (FLONASE) 50 MCG/ACT nasal spray Place 2 sprays into both nostrils daily. 04/10/18  Yes Boscia, Heather E, NP  lisinopril (PRINIVIL,ZESTRIL) 10 MG tablet Take 0.5 tablets (5 mg total) by mouth daily. 03/05/18  Yes Boscia, Greer Ee, NP  omeprazole (PRILOSEC) 40 MG capsule Take 1 capsule (40 mg total) by mouth daily. 10/20/17  Yes Boscia, Greer Ee, NP  sulfamethoxazole-trimethoprim (BACTRIM DS,SEPTRA DS) 800-160 MG tablet Take 1 tablet by mouth 2 (two) times daily. 04/10/18  Yes Boscia, Greer Ee, NP  alum & mag hydroxide-simeth (MAALOX MAX) 400-400-40 MG/5ML suspension Take 5 mLs by mouth every 6 (six) hours as needed for indigestion. 04/30/18   Rudene Re, MD  famotidine (PEPCID) 40 MG tablet Take 1 tablet (40 mg total) by mouth daily. 04/30/18 04/30/19  Alfred Levins,  [place], MD  meclizine (ANTIVERT) 25 MG tablet Take 1 tablet (25 mg total) by mouth 3 (three) times daily as needed for dizziness. Patient not taking: Reported on 04/30/2018 11/02/15   Rudene Re, MD  ondansetron (ZOFRAN ODT) 4 MG disintegrating tablet Take 1 tablet (4 mg total) by mouth every 8 (eight) hours as needed. 04/30/18   Rudene Re, MD    Allergies Patient has no known allergies.  Family  History  Problem Relation Age of Onset  . Breast cancer Maternal Aunt 68    Social History Social History   Tobacco Use  . Smoking status: Current Some Day Smoker    Packs/day: 0.25    Years: 30.00    Pack years: 7.50  . Smokeless tobacco: Current User  Substance Use Topics  . Alcohol use: No  . Drug use: No    Review of Systems  Constitutional: Negative for fever. Eyes: Negative for visual changes. ENT: Negative for sore throat. Neck: No neck pain  Cardiovascular: Negative for chest pain. Respiratory: Negative for shortness of breath. Gastrointestinal: + upper abdominal pain and nausea. No vomiting or diarrhea. Genitourinary: Negative for dysuria. Musculoskeletal: Negative for back pain. Skin: Negative for rash. Neurological: Negative for headaches, weakness or numbness. Psych: No SI or HI  ____________________________________________   PHYSICAL EXAM:  VITAL SIGNS: ED Triage Vitals  Enc Vitals Group     BP 04/30/18 0900 (!) 161/100     Pulse Rate 04/30/18 0900 74     Resp 04/30/18 0900 18     Temp 04/30/18 0900 (!) 97.4 F (36.3 C)     Temp Source 04/30/18 0900 Oral     SpO2 04/30/18 0900 100 %     Weight 04/30/18 0856 168 lb (76.2 kg)     Height 04/30/18 0856 5\' 2"  (1.575 m)     Head Circumference --      Peak Flow --      Pain Score 04/30/18 0856 7     Pain Loc --      Pain Edu? --      Excl. in [place]? --     Constitutional: Alert and oriented. Well appearing and in no apparent distress. HEENT:      Head: Normocephalic and atraumatic.         Eyes: Conjunctivae are normal. Sclera is non-icteric.       Mouth/Throat: Mucous membranes are moist.       Neck: Supple with no signs of meningismus. Cardiovascular: Regular rate and rhythm. No murmurs, gallops, or rubs. 2+ symmetrical distal pulses are present in all extremities. No JVD. Respiratory: Normal respiratory effort. Lungs are clear to auscultation bilaterally. No wheezes, crackles, or rhonchi.    Gastrointestinal: Soft, tender to palpation over the epigastric region and LUQ, and non distended with positive bowel sounds. No rebound or guarding. Genitourinary: No CVA tenderness. Musculoskeletal: Nontender with normal range of motion in all extremities. No edema, cyanosis, or erythema of extremities. Neurologic: Normal speech and language. Face is symmetric. Moving all extremities. No gross focal neurologic deficits are appreciated. Skin: Skin is warm, dry and intact. No rash noted. Psychiatric: Mood and affect are normal. Speech and behavior are normal.  ____________________________________________   LABS (all labs ordered are listed, but only abnormal results are displayed)  Labs Reviewed  COMPREHENSIVE METABOLIC PANEL - Abnormal; Notable for the following components:      Result Value   AST 14 (*)    All other components within normal limits  TROPONIN I  LIPASE, BLOOD  CBC WITH DIFFERENTIAL/PLATELET  TROPONIN I   ____________________________________________  EKG  ED ECG REPORT I, Rudene Re, the attending physician, personally viewed and interpreted this ECG.  Normal sinus rhythm, rate of 71, normal intervals, normal axis, no ST elevations or depressions.  Normal EKG. ____________________________________________  RADIOLOGY  I have personally reviewed the images performed during this visit and I agree with the Radiologist's read.   Interpretation by Radiologist:  Dg Chest 2 View  Result Date: 04/30/2018 CLINICAL DATA:  HEARTBURN FOR 2 DAYS.  PAIN UNDER LEFT BREAST. EXAM: CHEST - 2 VIEW COMPARISON:  11/02/2015. FINDINGS: The heart size and mediastinal contours are within normal limits. Both lungs are clear. The visualized skeletal structures are unremarkable. IMPRESSION: No active cardiopulmonary disease. Stable appearance when degree of inspiration is considered. Electronically Signed   By: Staci Righter M.D.   On: 04/30/2018 09:26      ____________________________________________   PROCEDURES  Procedure(s) performed: None Procedures Critical Care performed:  None ____________________________________________   INITIAL IMPRESSION / ASSESSMENT AND PLAN / ED COURSE  53 y.o. female with a history of anxiety, arthritis, GERD, headaches, hypertension who presents for evaluation of constant pressure-like pain in the left upper quadrant epigastric region.  Patient has a history of GERD.  She is status post cholecystectomy several years ago.  She is tender to palpation on the left upper quadrant and epigastric region with no rebound or guarding.  EKG showing no evidence of ischemia changes.  Troponin x2 is negative.  Patient is low risk based on heart score for ACS.  Also the fact the pain is constant for several days with 2- troponins at this time low suspicion for ACS.  Labs including CBC, CMP and lipase are within normal limits. CXR with no evidence of pneumonia, free air under the diaphragm, hiatal hernia.  Patient was given GI cocktail, Pepcid, Zofran, fentanyl, and IV fluids.  No signs of SBO clinically.  At this time her presentation is concerning for GERD versus peptic ulcer disease versus gastritis.  Will discharge home on Zofran, Maalox, Pepcid, and follow-up with GI.  Discussed in a return precautions.      As part of my medical decision making, I reviewed the following data within the [facility] notes reviewed and incorporated, Labs reviewed , EKG interpreted , Old chart reviewed, Radiograph reviewed , Notes from prior ED visits and [place] Controlled Substance Database    Pertinent labs & imaging results that were available during my care of the patient were reviewed by me and considered in my medical decision making (see chart for details).    ____________________________________________   FINAL CLINICAL IMPRESSION(S) / ED DIAGNOSES  Final diagnoses:  LUQ abdominal pain  Gastroesophageal  reflux disease, esophagitis presence not specified      NEW MEDICATIONS STARTED DURING THIS VISIT:  ED Discharge Orders         Ordered    famotidine (PEPCID) 40 MG tablet  Daily     04/30/18 1314    ondansetron (ZOFRAN ODT) 4 MG disintegrating tablet  Every 8 hours PRN     04/30/18 1314    alum & mag hydroxide-simeth (MAALOX MAX) 626-948-54 MG/5ML suspension  Every 6 hours PRN     04/30/18 1314           Note:  This document was prepared using Dragon voice recognition software and may include unintentional dictation errors.    Alfred Levins, [place], MD 04/30/18 1315

## 2018-05-01 ENCOUNTER — Telehealth: Payer: Self-pay

## 2018-05-01 NOTE — Telephone Encounter (Signed)
ED dr from [facility] [phone] lmom that pt went to ED twice for chest pain and elevated BP I lmom with dana we going to schedule pt appt for elevated BP and also spoke with pt she still at ED she will call us back and make appt for elevated Bp

## 2018-05-15 ENCOUNTER — Ambulatory Visit: Payer: Medicaid Other | Admitting: Gastroenterology

## 2018-07-11 ENCOUNTER — Ambulatory Visit: Payer: 59 | Admitting: Adult Health

## 2018-07-11 ENCOUNTER — Encounter: Payer: Self-pay | Admitting: Adult Health

## 2018-07-11 ENCOUNTER — Other Ambulatory Visit: Payer: Self-pay

## 2018-07-11 VITALS — BP 158/84 | HR 80 | Resp 16 | Ht 62.0 in | Wt 172.0 lb

## 2018-07-11 DIAGNOSIS — T7840XD Allergy, unspecified, subsequent encounter: Secondary | ICD-10-CM

## 2018-07-11 DIAGNOSIS — Z0289 Encounter for other administrative examinations: Secondary | ICD-10-CM | POA: Diagnosis not present

## 2018-07-11 DIAGNOSIS — I10 Essential (primary) hypertension: Secondary | ICD-10-CM | POA: Diagnosis not present

## 2018-07-11 NOTE — Patient Instructions (Signed)

## 2018-07-11 NOTE — Progress Notes (Signed)
[facility] [place], [place] [address]  Internal MEDICINE  Office Visit Note  Patient Name: Taylor Morris  [id number]  [id number]  Date of Service: 07/11/2018  Chief Complaint  Patient presents with  . Medical Management of Chronic Issues    need paperwork for work filled out , do not want to use a mask at work , medication refills   . Anxiety  . Allergic Rhinitis   . Hypertension    bp is elevated     HPI  PT is here today for follow up on anxiety, allergic rhinitis and HTN.  She is mainly in need of having some paperwork signed so that she does not have to wear a N95 mask.  Due to Covid, her office is going to require her to wear a mask unless he has a doctors note.  Due to her anxiety and breathing issues she is unable to tolerate a N95 mask.  Overall she reports she is doing well.  Denies any issues with her blood pressure.  She was initially elevated, however at recheck was 158/84. She reports her allergy symptoms and anxiety are at baseline.    Current Medication: Outpatient Encounter Medications as of 07/11/2018  Medication Sig  . clonazePAM (KLONOPIN) 0.5 MG tablet Take 1 tablet (0.5 mg total) by mouth daily as needed for anxiety.  . fluticasone (FLONASE) 50 MCG/ACT nasal spray Place 2 sprays into both nostrils daily.  Marland Kitchen lisinopril (PRINIVIL,ZESTRIL) 10 MG tablet Take 0.5 tablets (5 mg total) by mouth daily.  . [DISCONTINUED] alum & mag hydroxide-simeth (MAALOX MAX) 400-400-40 MG/5ML suspension Take 5 mLs by mouth every 6 (six) hours as needed for indigestion. (Patient not taking: Reported on 07/11/2018)  . [DISCONTINUED] famotidine (PEPCID) 40 MG tablet Take 1 tablet (40 mg total) by mouth daily. (Patient not taking: Reported on 07/11/2018)  . [DISCONTINUED] meclizine (ANTIVERT) 25 MG tablet Take 1 tablet (25 mg total) by mouth 3 (three) times daily as needed for dizziness. (Patient not taking: Reported on 04/30/2018)  . [DISCONTINUED] omeprazole  (PRILOSEC) 40 MG capsule Take 1 capsule (40 mg total) by mouth daily. (Patient not taking: Reported on 07/11/2018)  . [DISCONTINUED] ondansetron (ZOFRAN ODT) 4 MG disintegrating tablet Take 1 tablet (4 mg total) by mouth every 8 (eight) hours as needed. (Patient not taking: Reported on 07/11/2018)  . [DISCONTINUED] sulfamethoxazole-trimethoprim (BACTRIM DS,SEPTRA DS) 800-160 MG tablet Take 1 tablet by mouth 2 (two) times daily. (Patient not taking: Reported on 07/11/2018)   No facility-administered encounter medications on file as of 07/11/2018.     Surgical History: Past Surgical History:  Procedure Laterality Date  . ABDOMINAL HYSTERECTOMY    . BREAST BIOPSY Right 07/2014   surgery to remove abcess 3:00/ Dr Marina Gravel  . BREAST DUCTAL SYSTEM EXCISION Right 07/22/2014   Procedure: right breast major duct excision ;  Surgeon: Sherri Rad, MD;  Location: [facility] ORS;  Service: General;  Laterality: Right;  . CESAREAN SECTION     times 2  . CHOLECYSTECTOMY    . INCISION AND DRAINAGE BREAST ABSCESS Right 05/16  . TUBAL LIGATION      Medical History: Past Medical History:  Diagnosis Date  . Anxiety   . Arthritis   . Complication of anesthesia   . H/O cesarean section    times 2  . PONV (postoperative nausea and vomiting)     Family History: Family History  Problem Relation Age of Onset  . Breast cancer Maternal Aunt 68  Social History   Socioeconomic History  . Marital status: Widowed    Spouse name: Not on file  . Number of children: Not on file  . Years of education: Not on file  . Highest education level: Not on file  Occupational History  . Not on file  Social Needs  . Financial resource strain: Not on file  . Food insecurity:    Worry: Not on file    Inability: Not on file  . Transportation needs:    Medical: Not on file    Non-medical: Not on file  Tobacco Use  . Smoking status: Current Some Day Smoker    Packs/day: 0.25    Years: 30.00    Pack years: 7.50  .  Smokeless tobacco: Current User  Substance and Sexual Activity  . Alcohol use: No  . Drug use: No  . Sexual activity: Not on file  Lifestyle  . Physical activity:    Days per week: Not on file    Minutes per session: Not on file  . Stress: Not on file  Relationships  . Social connections:    Talks on phone: Not on file    Gets together: Not on file    Attends religious service: Not on file    Active member of club or organization: Not on file    Attends meetings of clubs or organizations: Not on file    Relationship status: Not on file  . Intimate partner violence:    Fear of current or ex partner: Not on file    Emotionally abused: Not on file    Physically abused: Not on file    Forced sexual activity: Not on file  Other Topics Concern  . Not on file  Social History Narrative  . Not on file      Review of Systems  Constitutional: Negative for chills, fatigue and unexpected weight change.  HENT: Negative for congestion, rhinorrhea, sneezing and sore throat.   Eyes: Negative for photophobia, pain and redness.  Respiratory: Negative for cough, chest tightness and shortness of breath.   Cardiovascular: Negative for chest pain and palpitations.  Gastrointestinal: Negative for abdominal pain, constipation, diarrhea, nausea and vomiting.  Endocrine: Negative.   Genitourinary: Negative for dysuria and frequency.  Musculoskeletal: Negative for arthralgias, back pain, joint swelling and neck pain.  Skin: Negative for rash.  Allergic/Immunologic: Negative.   Neurological: Negative for tremors and numbness.  Hematological: Negative for adenopathy. Does not bruise/bleed easily.  Psychiatric/Behavioral: Negative for behavioral problems and sleep disturbance. The patient is not nervous/anxious.     Vital Signs: BP (!) 158/84   Pulse 80   Resp 16   Ht 5\' 2"  (1.575 m)   Wt 172 lb (78 kg)   SpO2 98%   BMI 31.46 kg/m    Physical Exam Vitals signs and nursing note reviewed.   Constitutional:      General: She is not in acute distress.    Appearance: She is well-developed. She is not diaphoretic.  HENT:     Head: Normocephalic and atraumatic.     Mouth/Throat:     Pharynx: No oropharyngeal exudate.  Eyes:     Pupils: Pupils are equal, round, and reactive to light.  Neck:     Musculoskeletal: Normal range of motion and neck supple.     Thyroid: No thyromegaly.     Vascular: No JVD.     Trachea: No tracheal deviation.  Cardiovascular:     Rate and Rhythm: Normal rate  and regular rhythm.     Heart sounds: Normal heart sounds. No murmur. No friction rub. No gallop.   Pulmonary:     Effort: Pulmonary effort is normal. No respiratory distress.     Breath sounds: Normal breath sounds. No wheezing or rales.  Chest:     Chest wall: No tenderness.  Abdominal:     Palpations: Abdomen is soft.     Tenderness: There is no abdominal tenderness. There is no guarding.  Musculoskeletal: Normal range of motion.  Lymphadenopathy:     Cervical: No cervical adenopathy.  Skin:    General: Skin is warm and dry.  Neurological:     Mental Status: She is alert and oriented to person, place, and time.     Cranial Nerves: No cranial nerve deficit.  Psychiatric:        Behavior: Behavior normal.        Thought Content: Thought content normal.        Judgment: Judgment normal.     Assessment/Plan: 1. Essential hypertension Stable, continue present management.  2. Allergic state, subsequent encounter Stable, continue present management.   3. Encounter for completion of form with patient Form completed with patient.    General Counseling: amaiah cristiano understanding of the findings of todays visit and agrees with plan of treatment. I have discussed any further diagnostic evaluation that may be needed or ordered today. We also reviewed her medications today. she has been encouraged to call the office with any questions or concerns that should arise related to  todays visit.    No orders of the defined types were placed in this encounter.   No orders of the defined types were placed in this encounter.   Time spent: 30 Minutes   This patient was seen by Orson Gear AGNP-C in Collaboration with Dr Lavera Guise as a part of collaborative care agreement     Kendell Bane AGNP-C Internal medicine

## 2018-09-03 ENCOUNTER — Telehealth: Payer: Self-pay

## 2018-09-03 NOTE — Telephone Encounter (Signed)
Pt called that she exposure to covid 19 at her work she said her work told her to quarantine for 14 days pt don't have symptoms pt like to wait for  testing and she call us back for appt as per heather it ok and advised pt to stay quarantine for 14 days

## 2018-09-04 ENCOUNTER — Encounter: Payer: Self-pay | Admitting: Adult Health

## 2018-09-04 ENCOUNTER — Other Ambulatory Visit: Payer: Self-pay

## 2018-09-04 ENCOUNTER — Telehealth: Payer: Self-pay | Admitting: *Deleted

## 2018-09-04 ENCOUNTER — Ambulatory Visit: Payer: 59 | Admitting: Adult Health

## 2018-09-04 VITALS — Temp 98.1°F | Resp 16 | Ht 62.0 in | Wt 163.0 lb

## 2018-09-04 DIAGNOSIS — F411 Generalized anxiety disorder: Secondary | ICD-10-CM | POA: Diagnosis not present

## 2018-09-04 DIAGNOSIS — Z20822 Contact with and (suspected) exposure to covid-19: Secondary | ICD-10-CM

## 2018-09-04 DIAGNOSIS — Z20828 Contact with and (suspected) exposure to other viral communicable diseases: Secondary | ICD-10-CM

## 2018-09-04 NOTE — Telephone Encounter (Signed)
-----   Message from Corlis Hove sent at 09/04/2018 12:26 PM EDT ----- Please call and schedule pt for covid 19 test pt was exposure to covid

## 2018-09-04 NOTE — Progress Notes (Signed)
[facility] [place], [place] [address]  Internal MEDICINE  Telephone Visit  Patient Name: Taylor Morris  [id number]  [id number]  Date of Service: 09/04/2018  I connected with the patient at 1209 by video and verified the patients identity using two identifiers.   I discussed the limitations, risks, security and privacy concerns of performing an evaluation and management service by telephone and the availability of in person appointments. I also discussed with the patient that there may be a patient responsible charge related to the service.  The patient expressed understanding and agrees to proceed.    Chief Complaint  Patient presents with  . Telephone Screen  . Telephone Assessment  . Medical Management of Chronic Issues    exposure  to covid19  . 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 She is a smoker and always has a cough, although she feels like it is worse now.  She has bene very anxious and stressed since she was told she was exposed. She has even had a few episodes of diarrhea. Denies other symptoms at this time.    Current Medication: Outpatient Encounter Medications as of 09/04/2018  Medication Sig  . clonazePAM (KLONOPIN) 0.5 MG tablet Take 1 tablet (0.5 mg total) by mouth daily as needed for anxiety.  . fluticasone (FLONASE) 50 MCG/ACT nasal spray Place 2 sprays into both nostrils daily.  Marland Kitchen lisinopril (PRINIVIL,ZESTRIL) 10 MG tablet Take 0.5 tablets (5 mg total) by mouth daily.   No facility-administered encounter medications on file as of 09/04/2018.     Surgical History: Past Surgical History:  Procedure Laterality Date  . ABDOMINAL HYSTERECTOMY    . BREAST BIOPSY Right 07/2014   surgery to remove abcess 3:00/ Dr Marina Gravel  . BREAST DUCTAL SYSTEM EXCISION Right 07/22/2014   Procedure: right breast major duct excision ;  Surgeon: Sherri Rad, MD;  Location:  [facility] ORS;  Service: General;  Laterality: Right;  . CESAREAN SECTION     times 2  . CHOLECYSTECTOMY    . INCISION AND DRAINAGE BREAST ABSCESS Right 05/16  . TUBAL LIGATION      Medical History: Past Medical History:  Diagnosis Date  . Anxiety   . Arthritis   . Complication of anesthesia   . H/O cesarean section    times 2  . PONV (postoperative nausea and vomiting)     Family History: Family History  Problem Relation Age of Onset  . Breast cancer Maternal Aunt 68    Social History   Socioeconomic History  . Marital status: Widowed    Spouse name: Not on file  . Number of children: Not on file  . Years of education: Not on file  . Highest education level: Not on file  Occupational History  . Not on file  Social Needs  . Financial resource strain: Not on file  . Food insecurity    Worry: Not on file    Inability: Not on file  . Transportation needs    Medical: Not on file    Non-medical: Not on file  Tobacco Use  . Smoking status: Current Some Day Smoker    Packs/day: 0.25    Years: 30.00    Pack years: 7.50  . Smokeless tobacco: Current User  Substance and Sexual Activity  . Alcohol use: No  . Drug use: No  .  Sexual activity: Not on file  Lifestyle  . Physical activity    Days per week: Not on file    Minutes per session: Not on file  . Stress: Not on file  Relationships  . Social Herbalist on phone: Not on file    Gets together: Not on file    Attends religious service: Not on file    Active member of club or organization: Not on file    Attends meetings of clubs or organizations: Not on file    Relationship status: Not on file  . Intimate partner violence    Fear of current or ex partner: Not on file    Emotionally abused: Not on file    Physically abused: Not on file    Forced sexual activity: Not on file  Other Topics Concern  . Not on file  Social History Narrative  . Not on file      Review of Systems  Constitutional:  Negative for chills, fatigue and unexpected weight change.  HENT: Negative for congestion, rhinorrhea, sneezing and sore throat.   Eyes: Negative for photophobia, pain and redness.  Respiratory: Positive for cough. Negative for chest tightness and shortness of breath.   Cardiovascular: Negative for chest pain and palpitations.  Gastrointestinal: Positive for diarrhea. Negative for abdominal pain, constipation, nausea and vomiting.  Endocrine: Negative.   Genitourinary: Negative for dysuria and frequency.  Musculoskeletal: Negative for arthralgias, back pain, joint swelling and neck pain.  Skin: Negative for rash.  Allergic/Immunologic: Negative.   Neurological: Negative for tremors and numbness.  Hematological: Negative for adenopathy. Does not bruise/bleed easily.  Psychiatric/Behavioral: Negative for behavioral problems and sleep disturbance. The patient is not nervous/anxious.     Vital Signs: Temp 98.1 F (36.7 C)   Resp 16   Ht 5\' 2"  (1.575 m)   Wt 163 lb (73.9 kg)   BMI 29.81 kg/m    Observation/Objective: Well appearing. nad noted.    Assessment/Plan: 1. Close Exposure to Covid-19 Virus PT will be tested for COVID-19.   2. Generalized anxiety disorder Pt having extra anxiety due to exposure.  Encouraged patient to take care of herself, and continue with quarantine until we can have her tested   General Counseling: Taylor Morris verbalizes understanding of the findings of today's phone visit and agrees with plan of treatment. I have discussed any further diagnostic evaluation that may be needed or ordered today. We also reviewed her medications today. she has been encouraged to call the office with any questions or concerns that should arise related to todays visit.    No orders of the defined types were placed in this encounter.   No orders of the defined types were placed in this encounter.   Time spent:12 Minutes    Orson Gear AGNP-C Internal medicine

## 2018-09-04 NOTE — Telephone Encounter (Signed)
Pt scheduled for covid-19 testing today at 2:30pm @ [facility]. Instructions given and order placed.

## 2018-09-07 ENCOUNTER — Telehealth: Payer: Self-pay | Admitting: *Deleted

## 2018-09-07 NOTE — Telephone Encounter (Signed)
Pt called to inquire about results of COVID test from 09/03/2018; explained to pt that results take 72 hours or 3 business days; also explained that her results will be available in my chart; she verbalized understanding.

## 2018-09-08 LAB — NOVEL CORONAVIRUS, NAA: SARS-CoV-2, NAA: NOT DETECTED

## 2018-10-26 ENCOUNTER — Ambulatory Visit (INDEPENDENT_AMBULATORY_CARE_PROVIDER_SITE_OTHER): Payer: 59 | Admitting: Nurse Practitioner

## 2018-10-26 ENCOUNTER — Encounter: Payer: Self-pay | Admitting: Nurse Practitioner

## 2018-10-26 ENCOUNTER — Other Ambulatory Visit: Payer: Self-pay

## 2018-10-26 VITALS — BP 149/97 | HR 79 | Temp 98.2°F | Resp 16 | Ht 62.0 in | Wt 170.0 lb

## 2018-10-26 DIAGNOSIS — R300 Dysuria: Secondary | ICD-10-CM

## 2018-10-26 DIAGNOSIS — F411 Generalized anxiety disorder: Secondary | ICD-10-CM | POA: Diagnosis not present

## 2018-10-26 DIAGNOSIS — Z124 Encounter for screening for malignant neoplasm of cervix: Secondary | ICD-10-CM

## 2018-10-26 DIAGNOSIS — N611 Abscess of the breast and nipple: Secondary | ICD-10-CM

## 2018-10-26 DIAGNOSIS — I10 Essential (primary) hypertension: Secondary | ICD-10-CM | POA: Diagnosis not present

## 2018-10-26 DIAGNOSIS — Z0001 Encounter for general adult medical examination with abnormal findings: Secondary | ICD-10-CM | POA: Diagnosis not present

## 2018-10-26 MED ORDER — CLONAZEPAM 0.5 MG PO TABS: 0.5000 mg | ORAL_TABLET | Freq: Every day | ORAL | 3 refills | Status: DC | PRN

## 2018-10-26 MED ORDER — MUPIROCIN 2 % EX OINT: 1.0000 "application " | TOPICAL_OINTMENT | Freq: Two times a day (BID) | CUTANEOUS | 1 refills | Status: DC

## 2018-10-26 MED ORDER — ATENOLOL 25 MG PO TABS: 25.0000 mg | ORAL_TABLET | Freq: Every day | ORAL | 3 refills | Status: DC

## 2018-10-26 MED ORDER — DOXYCYCLINE HYCLATE 100 MG PO TABS: 100.0000 mg | ORAL_TABLET | Freq: Two times a day (BID) | ORAL | 0 refills | Status: DC

## 2018-10-26 NOTE — Progress Notes (Signed)
[facility] [place], [place] [address]  Internal MEDICINE  Office Visit Note  Patient Name: Taylor Morris  [id number]  [id number]  Date of Service: 10/28/2018   Pt is here for routine health maintenance examination   Chief Complaint  Patient presents with  . Annual Exam  . Medication Management    lisinopril is giving chest pain and cough  . Anxiety  . Arthritis     The patient is here for health maintenance exam with pap smear. Her blood pressure is slightly elevated today. She has not taken her blood pressure medication. Generally takes this at night. She has not taken this today. She believes that she has dry cough and intermittent chest discomfort. Would like to try a different medication for blood pressure. She has had increased anxiety due to family issues. She states that she is taking her clonazepam recently.  She states that she is developing another lesion on the right breast, adjacent to the nipple. This has been ongoing issue for years. Has had to have area surgically drained and tissue removed. She has been intermittently treated with antibiotics.    Current Medication: Outpatient Encounter Medications as of 10/26/2018  Medication Sig  . clonazePAM (KLONOPIN) 0.5 MG tablet Take 1 tablet (0.5 mg total) by mouth daily as needed for anxiety.  . fluticasone (FLONASE) 50 MCG/ACT nasal spray Place 2 sprays into both nostrils daily.  . [DISCONTINUED] clonazePAM (KLONOPIN) 0.5 MG tablet Take 1 tablet (0.5 mg total) by mouth daily as needed for anxiety.  . [DISCONTINUED] lisinopril (PRINIVIL,ZESTRIL) 10 MG tablet Take 0.5 tablets (5 mg total) by mouth daily.  Marland Kitchen atenolol (TENORMIN) 25 MG tablet Take 1 tablet (25 mg total) by mouth daily. Take 1 tablet po QHS  . doxycycline (VIBRA-TABS) 100 MG tablet Take 1 tablet (100 mg total) by mouth 2 (two) times daily.  . mupirocin ointment (BACTROBAN) 2 % Place 1 application into the nose 2 (two) times daily.    No facility-administered encounter medications on file as of 10/26/2018.     Surgical History: Past Surgical History:  Procedure Laterality Date  . ABDOMINAL HYSTERECTOMY    . BREAST BIOPSY Right 07/2014   surgery to remove abcess 3:00/ Dr Marina Gravel  . BREAST DUCTAL SYSTEM EXCISION Right 07/22/2014   Procedure: right breast major duct excision ;  Surgeon: Sherri Rad, MD;  Location: [facility] ORS;  Service: General;  Laterality: Right;  . CESAREAN SECTION     times 2  . CHOLECYSTECTOMY    . INCISION AND DRAINAGE BREAST ABSCESS Right 05/16  . TUBAL LIGATION      Medical History: Past Medical History:  Diagnosis Date  . Anxiety   . Arthritis   . Complication of anesthesia   . H/O cesarean section    times 2  . PONV (postoperative nausea and vomiting)     Family History: Family History  Problem Relation Age of Onset  . Breast cancer Maternal Aunt 68      Review of Systems  Constitutional: Negative for activity change, chills, fatigue and unexpected weight change.  HENT: Negative for congestion, postnasal drip, rhinorrhea, sneezing and sore throat.   Respiratory: Negative for cough, chest tightness, shortness of breath and wheezing.   Cardiovascular: Negative for chest pain and palpitations.  Gastrointestinal: Negative for abdominal pain, constipation, diarrhea, nausea and vomiting.  Endocrine: Negative for cold intolerance, heat intolerance, polydipsia and polyuria.  Genitourinary: Negative for dysuria and frequency.  Musculoskeletal: Positive for back pain and  neck pain. Negative for arthralgias and joint swelling.  Skin: Negative for rash.       Localized infection of skin of right breast. Causes tenderness, redness and drainage from the lesion and from the nipple. Small area of darkened skin near the outer left eye. Patient feels this area is getting larger. No pain or itching associated with it.  Several small moles on her back.   Allergic/Immunologic: Negative for  environmental allergies.  Neurological: Positive for headaches. Negative for dizziness, tremors, weakness and numbness.  Hematological: Negative for adenopathy. Does not bruise/bleed easily.  Psychiatric/Behavioral: Positive for sleep disturbance. Negative for behavioral problems (Depression) and suicidal ideas. The patient is nervous/anxious.      Today's Vitals   10/26/18 1134  BP: (!) 149/97  Pulse: 79  Resp: 16  Temp: 98.2 F (36.8 C)  SpO2: 96%  Weight: 170 lb (77.1 kg)  Height: 5\' 2"  (1.575 m)   Body mass index is 31.09 kg/m.  Physical Exam Vitals signs and nursing note reviewed.  Constitutional:      General: She is not in acute distress.    Appearance: Normal appearance. She is well-developed. She is not diaphoretic.  HENT:     Head: Normocephalic and atraumatic.     Nose: Nose normal.     Mouth/Throat:     Pharynx: No oropharyngeal exudate.  Eyes:     Conjunctiva/sclera: Conjunctivae normal.     Pupils: Pupils are equal, round, and reactive to light.  Neck:     Musculoskeletal: Normal range of motion and neck supple.     Thyroid: No thyromegaly.     Vascular: No carotid bruit or JVD.     Trachea: No tracheal deviation.  Cardiovascular:     Rate and Rhythm: Normal rate and regular rhythm.     Pulses: Normal pulses.     Heart sounds: Normal heart sounds. No murmur. No friction rub. No gallop.   Pulmonary:     Effort: Pulmonary effort is normal. No respiratory distress.     Breath sounds: Normal breath sounds. No wheezing or rales.  Chest:     Chest wall: No tenderness.     Breasts:        Right: Inverted nipple, nipple discharge, skin change and tenderness present. No bleeding or mass.        Left: No swelling, bleeding, inverted nipple, mass, nipple discharge, skin change or tenderness.    Abdominal:     General: Bowel sounds are normal.     Palpations: Abdomen is soft.     Tenderness: There is no abdominal tenderness.     Hernia: There is no hernia in  the left inguinal area or right inguinal area.  Genitourinary:    General: Normal vulva.     Labia:        Right: No tenderness or lesion.        Left: No tenderness or lesion.      Vagina: Normal. No vaginal discharge, erythema or tenderness.     Cervix: No discharge or erythema.     Adnexa: Right adnexa normal and left adnexa normal.     Comments: No tenderness, masses, or organomeglay present during bimanual exam . Musculoskeletal: Normal range of motion.  Lymphadenopathy:     Cervical: No cervical adenopathy.     Lower Body: No right inguinal adenopathy. No left inguinal adenopathy.  Skin:    General: Skin is warm and dry.     Capillary Refill: Capillary refill takes less  than 2 seconds.     Comments: She has several, small, benign appearing moles on the upper right aspect of her back.   Neurological:     Mental Status: She is alert and oriented to person, place, and time.     Cranial Nerves: No cranial nerve deficit.  Psychiatric:        Behavior: Behavior normal.        Thought Content: Thought content normal.        Judgment: Judgment normal.     LABS: Recent Results (from the past 2160 hour(s))  Novel Coronavirus, NAA ([facility])     Status: None   Collection Time: 09/04/18  1:08 PM  Result Value Ref Range   SARS-CoV-2, NAA Not Detected Not Detected    Comment: Testing was performed using the cobas(R) SARS-CoV-2 test. This test was developed and its performance characteristics determined by [employer]. This test has not been FDA cleared or approved. This test has been authorized by FDA under an Emergency Use Authorization (EUA). This test is only authorized for the duration of time the declaration that circumstances exist justifying the authorization of the emergency use of in vitro diagnostic tests for detection of SARS-CoV-2 virus and/or diagnosis of COVID-19 infection under section 564(b)(1) of the Act, 21 U.S.C. 601UXN-2(T)(5), unless the authorization  is terminated or revoked sooner. When diagnostic testing is negative, the possibility of a false negative result should be considered in the context of a patient's recent exposures and the presence of clinical signs and symptoms consistent with COVID-19. An individual without symptoms of COVID-19 and who is not shedding SARS-CoV-2 virus would expect to have a negati ve (not detected) result in this assay.   Urinalysis, Routine w reflex microscopic     Status: Abnormal   Collection Time: 10/26/18 12:32 PM  Result Value Ref Range   Specific Gravity, UA 1.008 1.005 - 1.030   pH, UA 6.0 5.0 - 7.5   Color, UA Yellow Yellow   Appearance Ur Clear Clear   Leukocytes,UA Negative Negative   Protein,UA Negative Negative/Trace   Glucose, UA Negative Negative   Ketones, UA Negative Negative   RBC, UA Trace (A) Negative   Bilirubin, UA Negative Negative   Urobilinogen, Ur 0.2 0.2 - 1.0 mg/dL   Nitrite, UA Negative Negative   Microscopic Examination See below:     Comment: Microscopic was indicated and was performed.  Microscopic Examination     Status: None   Collection Time: 10/26/18 12:32 PM   GENITAL  Result Value Ref Range   WBC, UA 0-5 0 - 5 /hpf   RBC 0-2 0 - 2 /hpf   Epithelial Cells (non renal) 0-10 0 - 10 /hpf   Casts None seen None seen /lpf   Bacteria, UA Few None seen/Few   Assessment/Plan: 1. Encounter for general adult medical examination with abnormal findings Annual health maintenance exam with pap smear today.  2. Essential hypertension Start atenolol 25mg  at bedtime. D/c lisinopril  - atenolol (TENORMIN) 25 MG tablet; Take 1 tablet (25 mg total) by mouth daily. Take 1 tablet po QHS  Dispense: 30 tablet; Refill: 3  3. Abscess of right breast Start doxycycline 100mg  twice daily for 14 days. Apply bactroban ointment to affected areas bid as needed.  - doxycycline (VIBRA-TABS) 100 MG tablet; Take 1 tablet (100 mg total) by mouth 2 (two) times daily.  Dispense: 28 tablet;  Refill: 0 - mupirocin ointment (BACTROBAN) 2 %; Place 1 application into the nose 2 (  two) times daily.  Dispense: 22 g; Refill: 1  4. Generalized anxiety disorder May continue clonazepam 0.5mg  daily as needed for acute anxiety. New prescription sent to her pharmacy today,  - clonazePAM (KLONOPIN) 0.5 MG tablet; Take 1 tablet (0.5 mg total) by mouth daily as needed for anxiety.  Dispense: 30 tablet; Refill: 3  5. Routine cervical smear - Pap IG and HPV (high risk) DNA detection  6. Dysuria - Urinalysis, Routine w reflex microscopic  General Counseling: Loie verbalizes understanding of the findings of todays visit and agrees with plan of treatment. I have discussed any further diagnostic evaluation that may be needed or ordered today. We also reviewed her medications today. she has been encouraged to call the office with any questions or concerns that should arise related to todays visit.    Counseling:  Hypertension Counseling:   The following hypertensive lifestyle modification were recommended and discussed:  1. Limiting alcohol intake to less than 1 oz/day of ethanol:(24 oz of beer or 8 oz of wine or 2 oz of 100-proof whiskey). 2. Take baby ASA 81 mg daily. 3. Importance of regular aerobic exercise and losing weight. 4. Reduce dietary saturated fat and cholesterol intake for overall cardiovascular health. 5. Maintaining adequate dietary potassium, calcium, and magnesium intake. 6. Regular monitoring of the blood pressure. 7. Reduce sodium intake to less than 100 mmol/day (less than 2.3 gm of sodium or less than 6 gm of sodium choride)   This patient was seen by [place] with Dr Lavera Guise as a part of collaborative care agreement  Orders Placed This Encounter  Procedures  . Microscopic Examination  . Urinalysis, Routine w reflex microscopic    Meds ordered this encounter  Medications  . atenolol (TENORMIN) 25 MG tablet    Sig: Take 1 tablet (25  mg total) by mouth daily. Take 1 tablet po QHS    Dispense:  30 tablet    Refill:  3    Please d/c lisinopril    Order Specific Question:   Supervising Provider    Answer:   Lavera Guise [place]  . clonazePAM (KLONOPIN) 0.5 MG tablet    Sig: Take 1 tablet (0.5 mg total) by mouth daily as needed for anxiety.    Dispense:  30 tablet    Refill:  3    Order Specific Question:   Supervising Provider    Answer:   Lavera Guise [[id number]]  . doxycycline (VIBRA-TABS) 100 MG tablet    Sig: Take 1 tablet (100 mg total) by mouth 2 (two) times daily.    Dispense:  28 tablet    Refill:  0    Order Specific Question:   Supervising Provider    Answer:   Lavera Guise [[id number]]  . mupirocin ointment (BACTROBAN) 2 %    Sig: Place 1 application into the nose 2 (two) times daily.    Dispense:  22 g    Refill:  1    Order Specific Question:   Supervising Provider    Answer:   Lavera Guise [[id number]]    Time spent: [place], MD  Internal Medicine

## 2018-10-27 LAB — URINALYSIS, ROUTINE W REFLEX MICROSCOPIC
Bilirubin, UA: NEGATIVE
Glucose, UA: NEGATIVE
Ketones, UA: NEGATIVE
Leukocytes,UA: NEGATIVE
Nitrite, UA: NEGATIVE
Protein,UA: NEGATIVE
Specific Gravity, UA: 1.008 (ref 1.005–1.030)
Urobilinogen, Ur: 0.2 mg/dL (ref 0.2–1.0)
pH, UA: 6 (ref 5.0–7.5)

## 2018-10-27 LAB — MICROSCOPIC EXAMINATION: Casts: NONE SEEN /lpf

## 2018-10-28 DIAGNOSIS — R300 Dysuria: Secondary | ICD-10-CM | POA: Insufficient documentation

## 2018-10-28 DIAGNOSIS — N611 Abscess of the breast and nipple: Secondary | ICD-10-CM | POA: Insufficient documentation

## 2018-10-31 LAB — PAP IG AND HPV HIGH-RISK: HPV, high-risk: NEGATIVE

## 2018-11-01 NOTE — Progress Notes (Signed)
Normal pap. Discuss at visit 11/23/2018

## 2018-11-23 ENCOUNTER — Encounter: Payer: Self-pay | Admitting: Nurse Practitioner

## 2018-11-23 ENCOUNTER — Other Ambulatory Visit: Payer: Self-pay

## 2018-11-23 ENCOUNTER — Ambulatory Visit: Payer: 59 | Admitting: Nurse Practitioner

## 2018-11-23 VITALS — BP 132/83 | HR 81 | Resp 16 | Ht 62.0 in | Wt 171.0 lb

## 2018-11-23 DIAGNOSIS — N6019 Diffuse cystic mastopathy of unspecified breast: Secondary | ICD-10-CM

## 2018-11-23 DIAGNOSIS — I10 Essential (primary) hypertension: Secondary | ICD-10-CM | POA: Diagnosis not present

## 2018-11-23 DIAGNOSIS — N611 Abscess of the breast and nipple: Secondary | ICD-10-CM | POA: Diagnosis not present
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
## 2018-12-02 DIAGNOSIS — N6019 Diffuse cystic mastopathy of unspecified breast: Secondary | ICD-10-CM | POA: Insufficient documentation

## 2018-12-26 ENCOUNTER — Other Ambulatory Visit: Payer: Self-pay | Admitting: Nurse Practitioner

## 2019-01-15 ENCOUNTER — Ambulatory Visit
Admission: RE | Admit: 2019-01-15 | Discharge: 2019-01-15 | Disposition: A | Discharge status: Home / Self Care | Payer: 59 | Source: Ambulatory Visit | Attending: Nurse Practitioner | Admitting: Nurse Practitioner

## 2019-01-15 ENCOUNTER — Other Ambulatory Visit: Payer: Self-pay | Admitting: Diagnostic Radiology

## 2019-01-15 DIAGNOSIS — N611 Abscess of the breast and nipple: Secondary | ICD-10-CM | POA: Insufficient documentation

## 2019-01-15 DIAGNOSIS — N6019 Diffuse cystic mastopathy of unspecified breast: Secondary | ICD-10-CM | POA: Diagnosis present

## 2019-01-16 NOTE — Progress Notes (Signed)
Benign findings

## 2019-01-16 NOTE — Progress Notes (Signed)
Review with patient at visit 02/22/2019

## 2019-02-22 ENCOUNTER — Ambulatory Visit: Payer: 59 | Admitting: Nurse Practitioner

## 2019-03-13 ENCOUNTER — Telehealth: Payer: Self-pay

## 2019-03-13 NOTE — Telephone Encounter (Signed)
Called lmom informing patient of appointment. klh 

## 2019-03-14 ENCOUNTER — Ambulatory Visit: Payer: 59 | Admitting: Adult Health
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
## 2019-12-27 IMAGING — MG DIGITAL DIAGNOSTIC BILAT W/ TOMO W/ CAD
6 of 10 series · 6 of 30 positions shown · non-contrast
Comparison: Previous exam(s).

CLINICAL DATA: Patient with continued drainage from her right
breast. She initially developed a right breast infection and
abscess, which was subsequently drained. This was treated with
follow-up surgical excision, but she has had persistent drainage
since her excision. She has had a full year of daily antibiotics,
which did not resolve the apparent recurrent/persistent infection.
Currently, her symptoms are improved from several days ago. Drainage
is decreased and there is little pain.

EXAM:
DIGITAL DIAGNOSTIC BILATERAL MAMMOGRAM WITH CAD AND TOMO
ULTRASOUND RIGHT BREAST

[R CC synth-2D (1 of 2)]
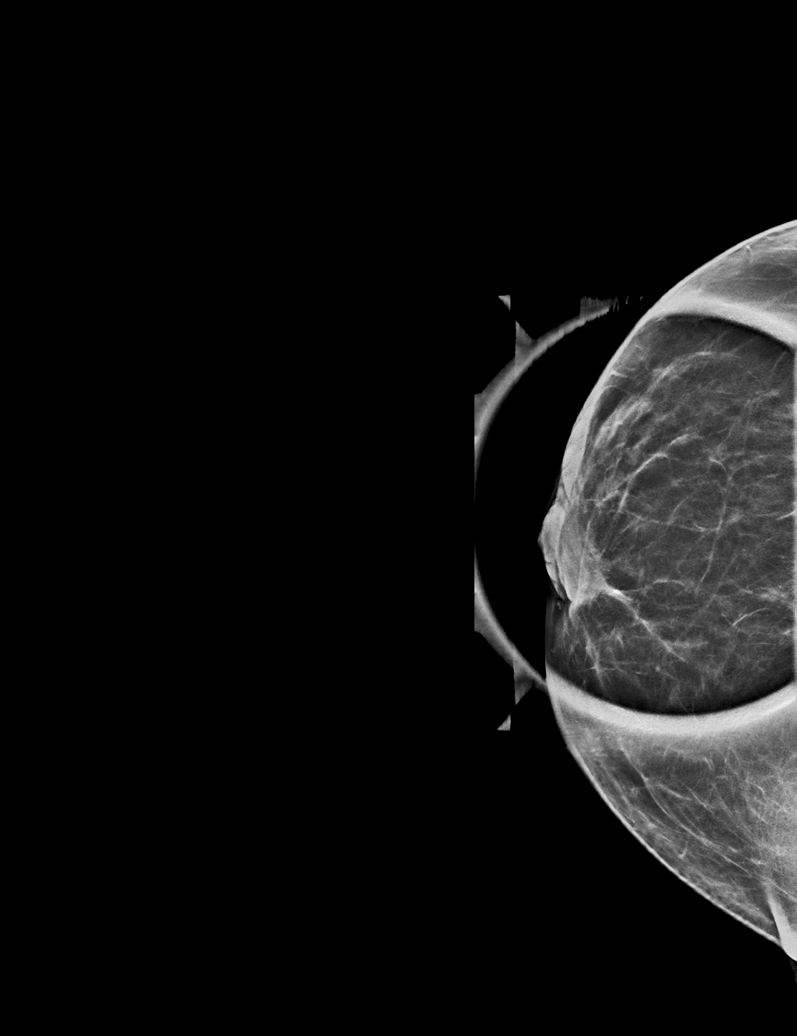

[R CC synth-2D (2 of 2)]
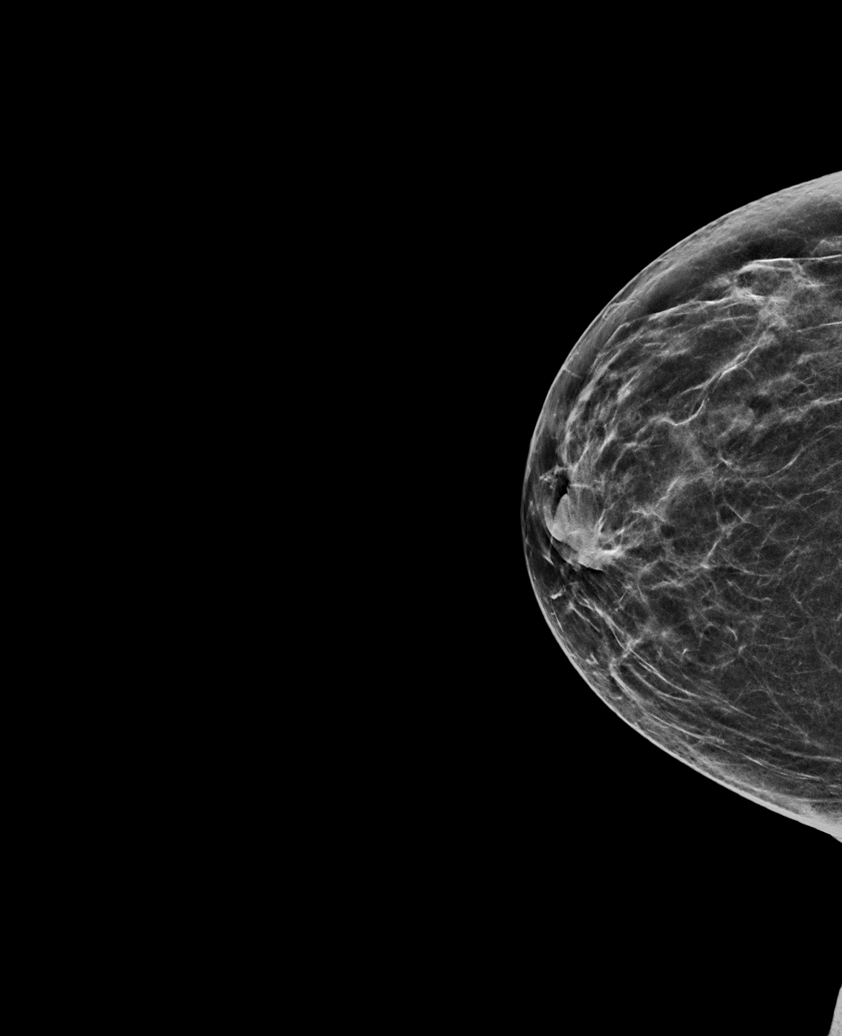

[L MLO synth-2D]
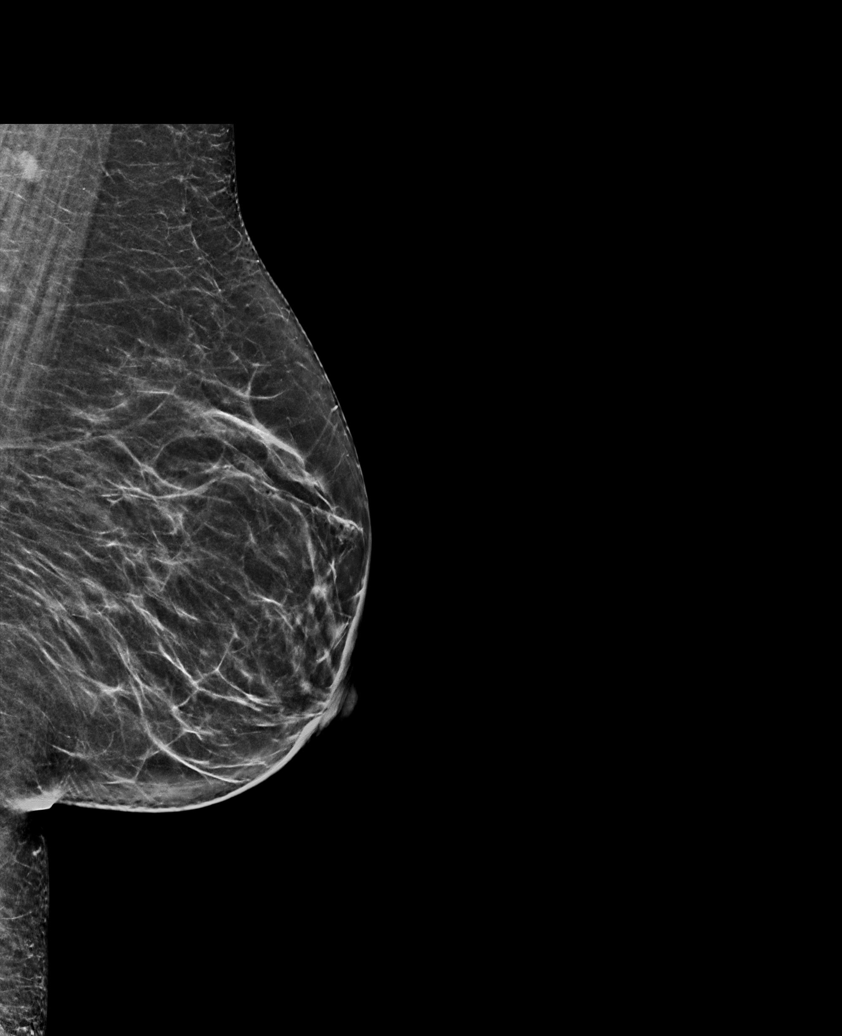

[L CC synth-2D]
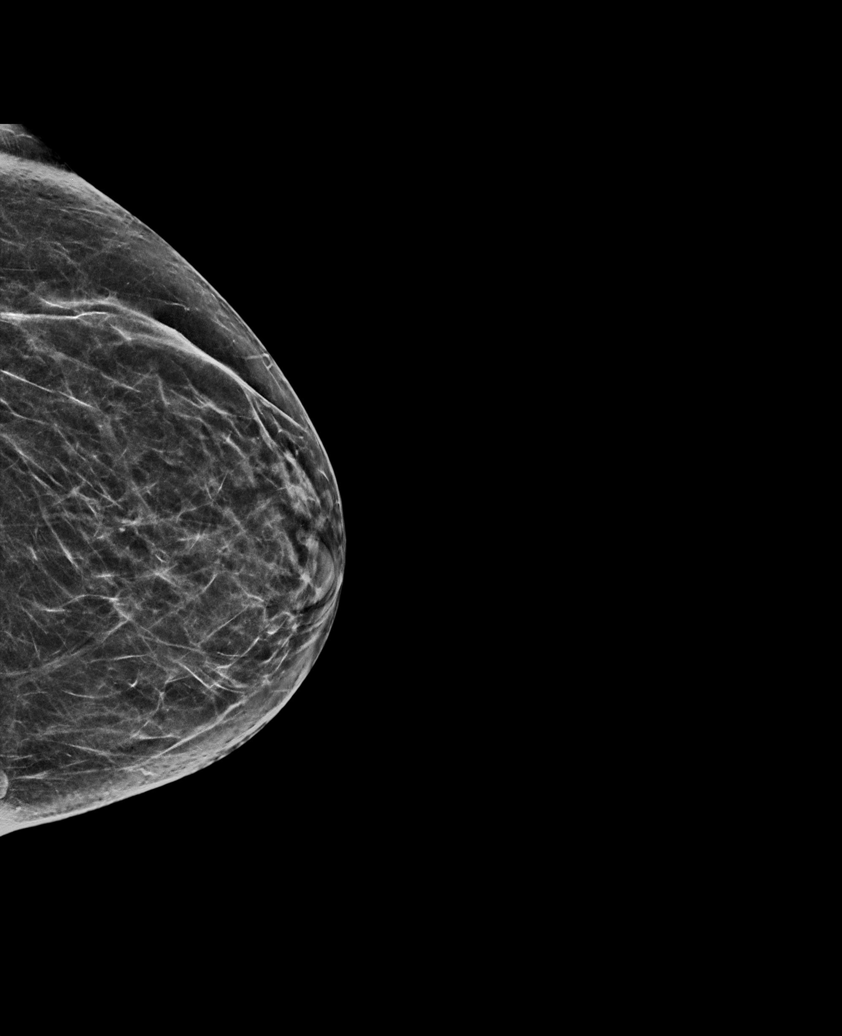

[R MLO synth-2D]
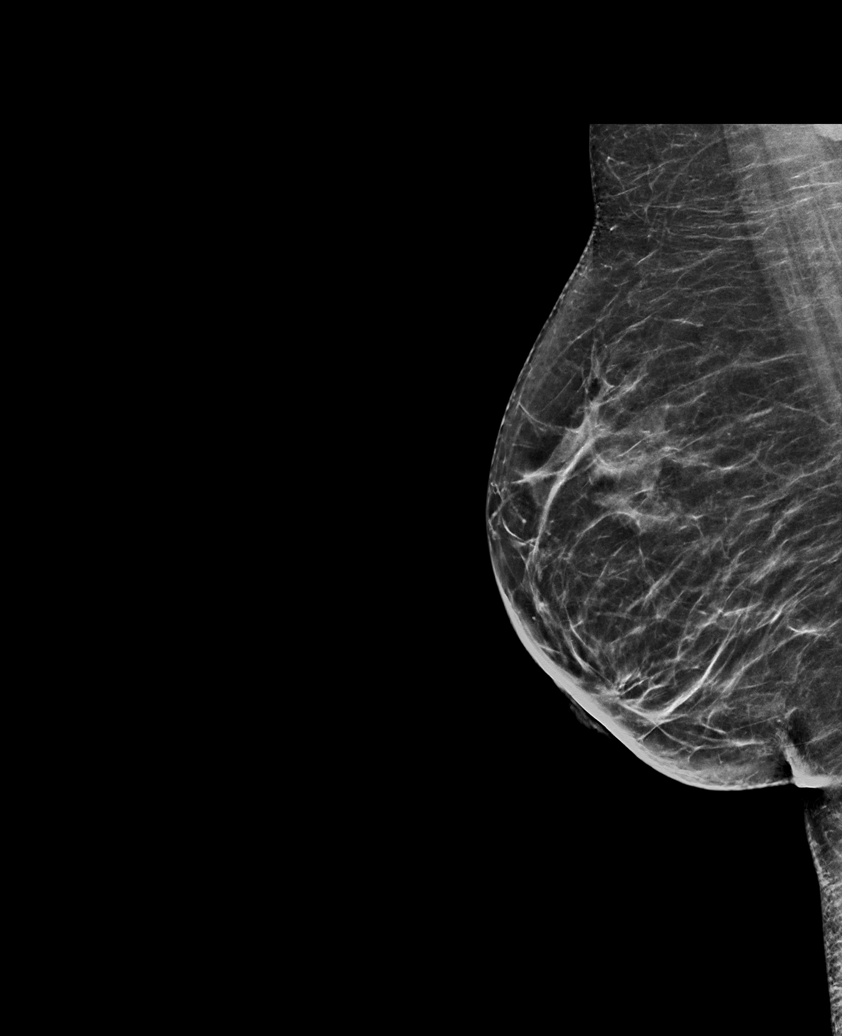

[R CC tomo · tomo slice 25/48.0]
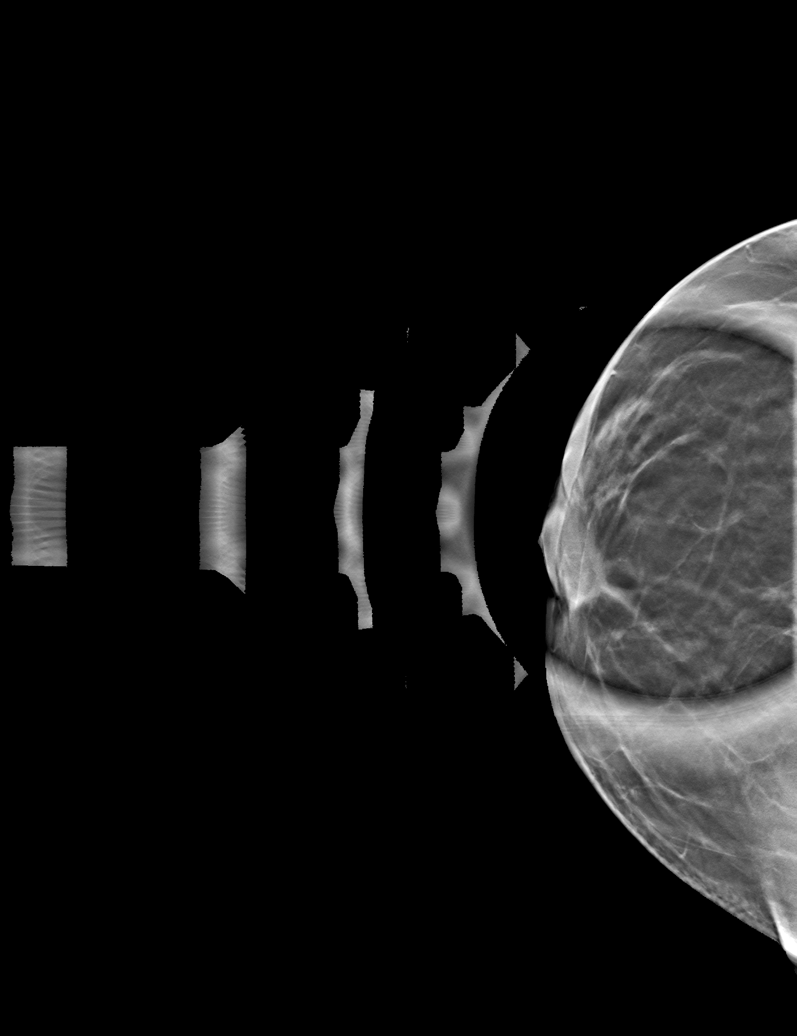

[6 of 30 positions shown; findings below may reference images not displayed]

ACR Breast Density Category b: There are scattered areas of
fibroglandular density.
FINDINGS: On the right, postsurgical scarring leads to mild nipple inversion.
There are no masses, areas of nonsurgical architectural distortion,
areas of new or significant asymmetry or suspicious calcifications.
No mammographic change from the most recent prior study.

Mammographic images were processed with CAD.

On physical exam, surgical scar is seen along the medial margin of
the right nipple. No spontaneous drainage seen from the region of
the scar or nipple.

Targeted ultrasound is performed, showing normal tissue in the
retroareolar and periareolar left breast with the exception of
shadowing from surgical scarring. No evidence of malignancy.
IMPRESSION: 1. No evidence of breast malignancy.
2. Indolent right breast infection or inflammation.
3. Benign postsurgical scarring on the right.

RECOMMENDATION:
1.  Screening mammogram in one year.(Code:[id number])
2. Surgical/clinical follow-up for the persistent symptoms of right
breast infection.

I have discussed the findings and recommendations with the patient.
If applicable, a reminder letter will be sent to the patient
regarding the next appointment.

BI-RADS CATEGORY  2: Benign.

## 2019-12-27 IMAGING — US US BREAST*R* LIMITED INC AXILLA
1 series · 4 of 4 positions shown · non-contrast
Comparison: Previous exam(s).

CLINICAL DATA: Patient with continued drainage from her right
breast. She initially developed a right breast infection and
abscess, which was subsequently drained. This was treated with
follow-up surgical excision, but she has had persistent drainage
since her excision. She has had a full year of daily antibiotics,
which did not resolve the apparent recurrent/persistent infection.
Currently, her symptoms are improved from several days ago. Drainage
is decreased and there is little pain.

EXAM:
DIGITAL DIAGNOSTIC BILATERAL MAMMOGRAM WITH CAD AND TOMO
ULTRASOUND RIGHT BREAST

[Series 1: us breast*right* limited inc axilla · 0.07mm/px · 4 of 4 slices shown]
[im 1/4]
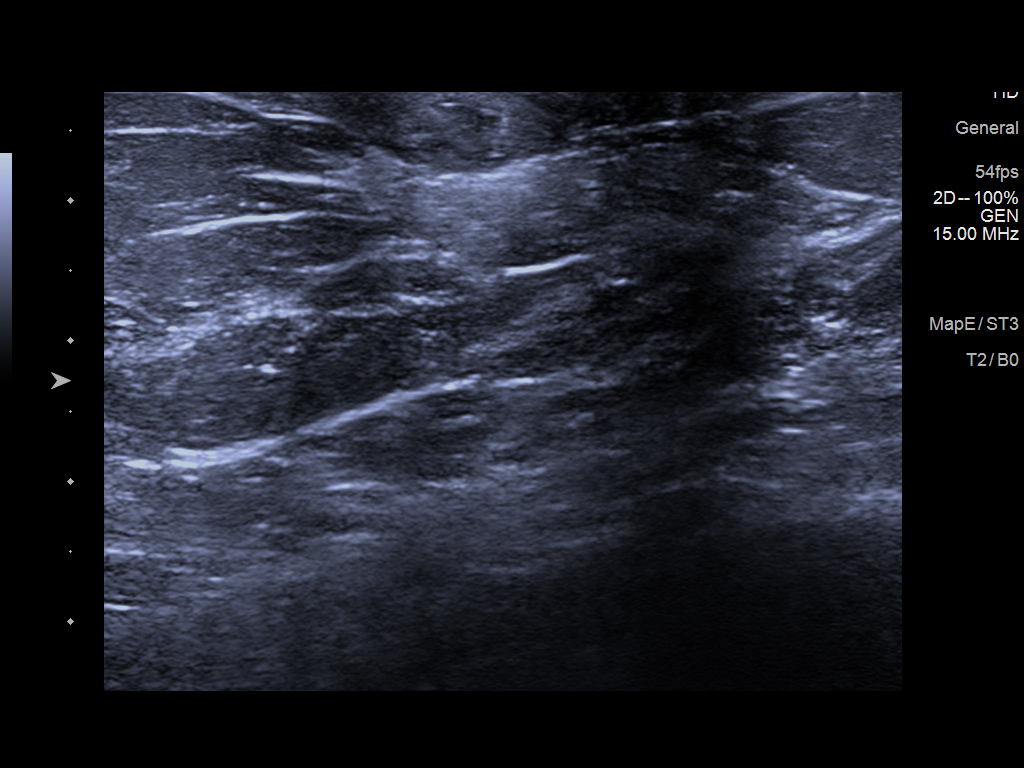
[im 2/4]
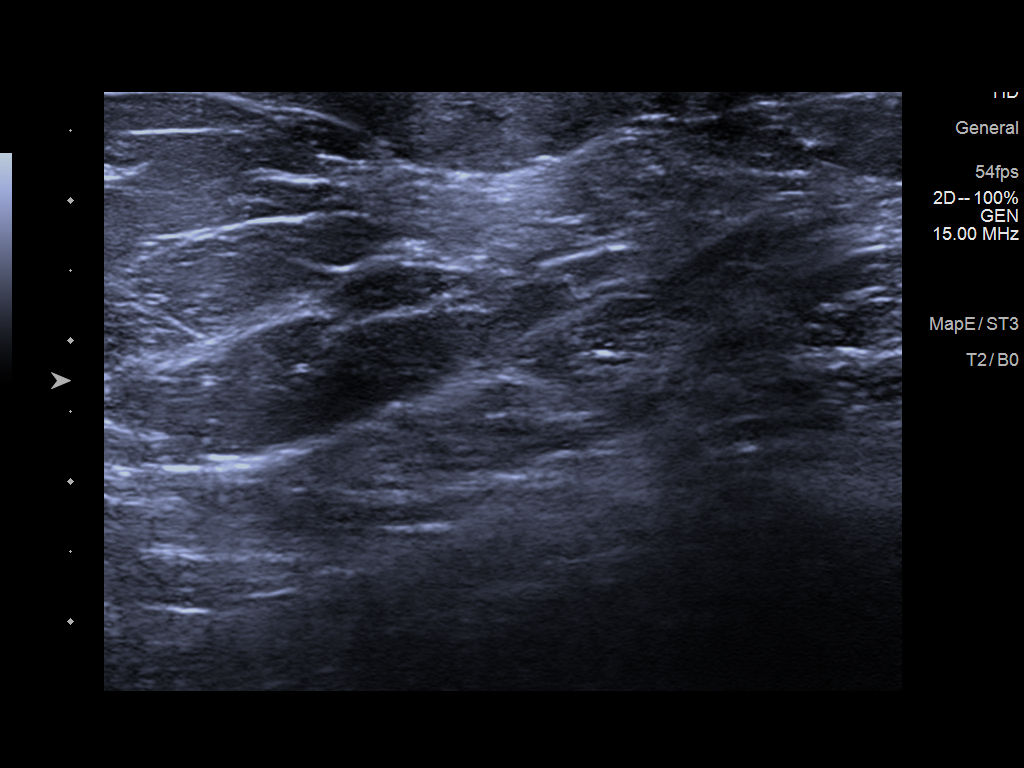
[im 3/4]
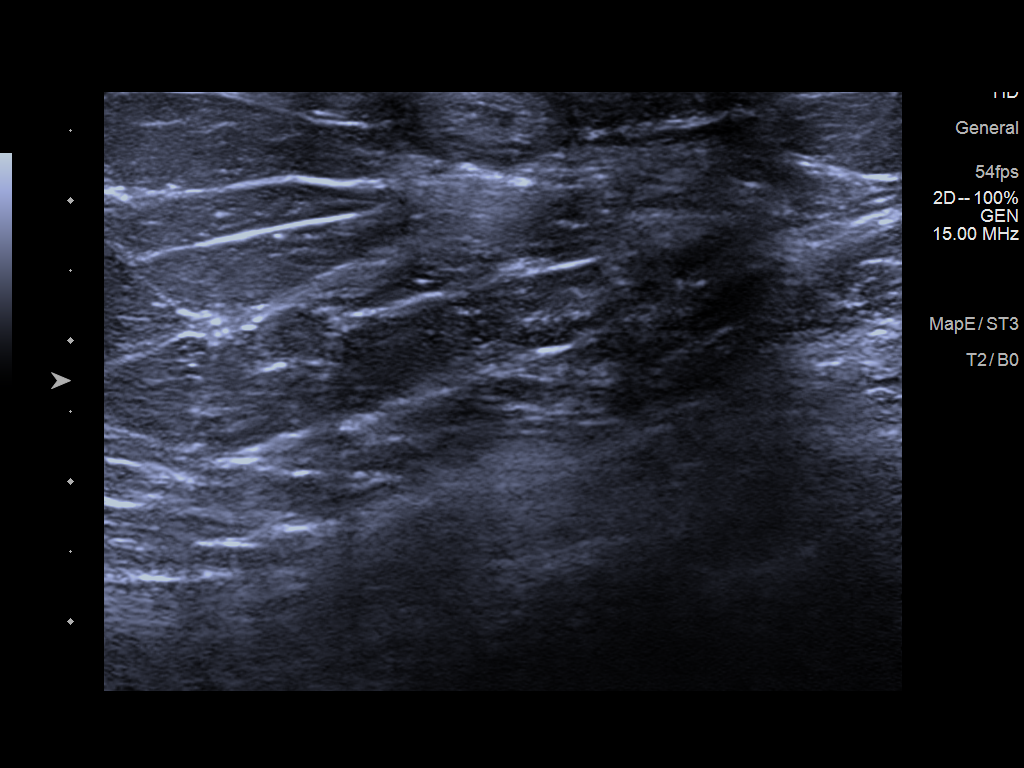
[im 4/4]
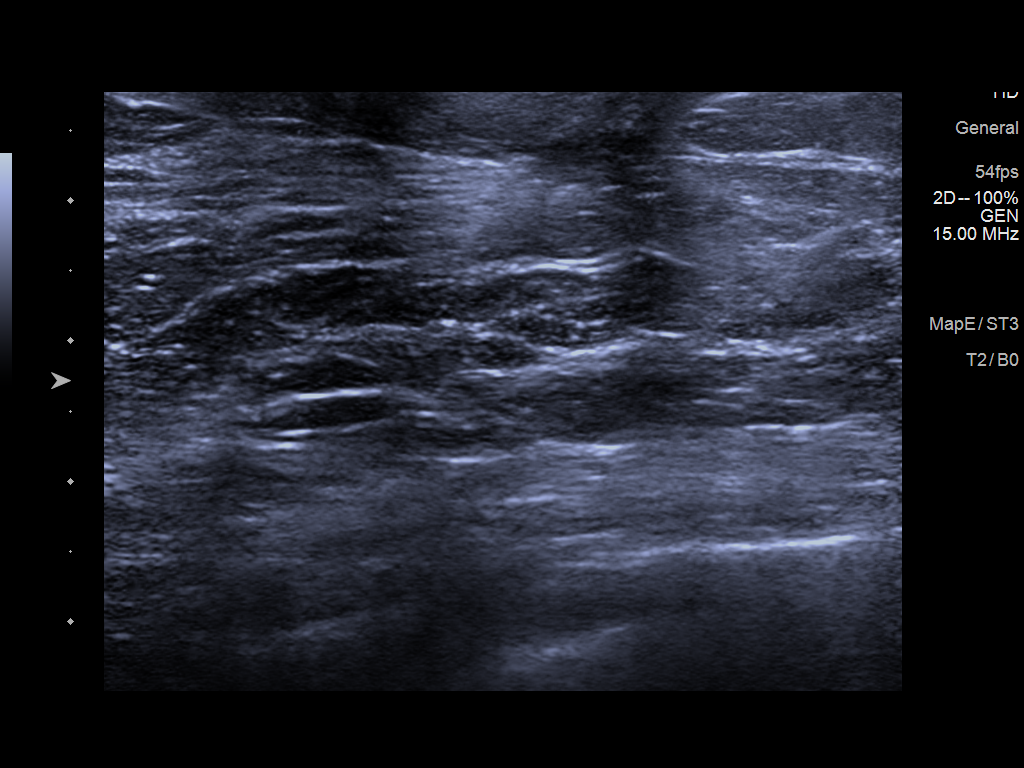

[4 of 4 positions shown; findings below may reference images not displayed]

ACR Breast Density Category b: There are scattered areas of
fibroglandular density.
FINDINGS: On the right, postsurgical scarring leads to mild nipple inversion.
There are no masses, areas of nonsurgical architectural distortion,
areas of new or significant asymmetry or suspicious calcifications.
No mammographic change from the most recent prior study.

Mammographic images were processed with CAD.

On physical exam, surgical scar is seen along the medial margin of
the right nipple. No spontaneous drainage seen from the region of
the scar or nipple.

Targeted ultrasound is performed, showing normal tissue in the
retroareolar and periareolar left breast with the exception of
shadowing from surgical scarring. No evidence of malignancy.
IMPRESSION: 1. No evidence of breast malignancy.
2. Indolent right breast infection or inflammation.
3. Benign postsurgical scarring on the right.

RECOMMENDATION:
1.  Screening mammogram in one year.(Code:[id number]-[id number])
2. Surgical/clinical follow-up for the persistent symptoms of right
breast infection.

I have discussed the findings and recommendations with the patient.
If applicable, a reminder letter will be sent to the patient
regarding the next appointment.

BI-RADS CATEGORY  2: Benign.

## 2020-08-26 ENCOUNTER — Telehealth: Payer: Self-pay

## 2020-08-26 NOTE — Telephone Encounter (Signed)
Patient called requesting a referral for her Vertigo. Patient has not been seen since Septmeber of 2020, after confirming that we are still her PCP I did let her know she would have to schedule an appointment to be seen as it has been 2 years and she declined and said she was going to go to the walk in clinic. Taylor Morris

## 2022-02-24 ENCOUNTER — Ambulatory Visit (INDEPENDENT_AMBULATORY_CARE_PROVIDER_SITE_OTHER): Payer: 59 | Admitting: Nurse Practitioner

## 2022-02-24 ENCOUNTER — Encounter: Payer: Self-pay | Admitting: Nurse Practitioner

## 2022-02-24 VITALS — BP 145/80 | HR 78 | Ht 62.0 in | Wt 192.4 lb

## 2022-02-24 DIAGNOSIS — Z7689 Persons encountering health services in other specified circumstances: Secondary | ICD-10-CM

## 2022-02-24 DIAGNOSIS — Z1231 Encounter for screening mammogram for malignant neoplasm of breast: Secondary | ICD-10-CM

## 2022-02-24 DIAGNOSIS — I10 Essential (primary) hypertension: Secondary | ICD-10-CM | POA: Diagnosis not present

## 2022-02-24 DIAGNOSIS — Z1211 Encounter for screening for malignant neoplasm of colon: Secondary | ICD-10-CM

## 2022-02-24 NOTE — Progress Notes (Signed)
New Patient Office Visit  Subjective    Patient ID: Taylor Morris, female    DOB: 1965-11-13  Age: 56 y.o. 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  Current cigarette smoker  -has been able to cut down cigarette use to 5 cigarettes per day   Mother passed away 17-Oct-2021  -she is the one who found her mom  -patient is seeing grief counselor to help process the loss   Outpatient Encounter Medications as of 02/24/2022  Medication Sig   [DISCONTINUED] atenolol (TENORMIN) 25 MG tablet Take 1 tablet (25 mg total) by mouth daily. Take 1 tablet po QHS   [DISCONTINUED] clonazePAM (KLONOPIN) 0.5 MG tablet Take 1 tablet (0.5 mg total) by mouth daily as needed for anxiety.   [DISCONTINUED] doxycycline (VIBRA-TABS) 100 MG tablet Take 1 tablet (100 mg total) by mouth 2 (two) times daily.   [DISCONTINUED] fluticasone (FLONASE) 50 MCG/ACT nasal spray Place 2 sprays into both nostrils daily.   [DISCONTINUED] mupirocin ointment (BACTROBAN) 2 % Place 1 application into the nose 2 (two) times daily.   No facility-administered encounter medications on file as of 02/24/2022.    Past Medical History:  Diagnosis Date   Anxiety    Arthritis    Complication of anesthesia    H/O cesarean section    times 2   PONV (postoperative nausea and vomiting)     Past Surgical History:  Procedure Laterality Date   ABDOMINAL HYSTERECTOMY     BREAST BIOPSY Right 07/2014   surgery to remove abcess 3:00/ Dr Marina Gravel   BREAST DUCTAL SYSTEM EXCISION Right 07/22/2014   Procedure: right breast major duct excision ;  Surgeon: Sherri Rad, MD;  Location: [facility] ORS;  Service: General;  Laterality: Right;   BREAST EXCISIONAL BIOPSY      CESAREAN SECTION     times 2   CHOLECYSTECTOMY     INCISION AND DRAINAGE BREAST ABSCESS Right 05/16   TUBAL LIGATION      Family History  Problem Relation Age of Onset   Hypertension Mother    High Cholesterol Mother    Hypertension Father    High Cholesterol Father    Breast cancer Maternal Aunt 7   Cancer Maternal Aunt     Social History   Socioeconomic History   Marital status: Widowed    Spouse name: Not on file   Number of children: Not on file   Years of education: Not on file   Highest education level: Not on file  Occupational History   Not on file  Tobacco Use   Smoking status: Some Days    Packs/day: 0.25    Years: 30.00    Total pack years: 7.50    Types: Cigarettes   Smokeless tobacco: Never  Vaping Use   Vaping Use: Never used  Substance and Sexual Activity   Alcohol use: Yes    Comment: ocassionally   Drug use: Yes    Types: Marijuana   Sexual activity: Not Currently  Other Topics Concern   Not on file  Social History Narrative   Not on file   Social Determinants of Health   Financial Resource Strain: Not on file  Food Insecurity: Not on file  Transportation Needs: Not on file  Physical Activity: Not on file  Stress: Not on file  Social Connections: Not on file  Intimate Partner Violence: Not on file    Review of Systems  Constitutional:  Negative for chills, fever and malaise/fatigue.  HENT:  Negative for congestion, sinus pain and sore throat.   Eyes: Negative.   Respiratory:  Negative for cough, shortness of breath and wheezing.   Cardiovascular:  Negative for chest pain, palpitations and leg swelling.       Mildly elevated blood pressure.   Gastrointestinal:  Negative for constipation, diarrhea, nausea and vomiting.  Genitourinary: Negative.   Musculoskeletal:  Negative for myalgias.  Skin: Negative.   Neurological:  Negative for dizziness and headaches.  Endo/Heme/Allergies:  Does not bruise/bleed easily.   Psychiatric/Behavioral:  Negative for depression. The patient is not nervous/anxious.         Objective    Today's Vitals   02/24/22 1037 02/24/22 1129  BP: (Abnormal) 145/82 (Abnormal) 145/80  Pulse: 78   SpO2: 97%   Weight: 192 lb 6.4 oz (87.3 kg)   Height: '5\' 2"'$  (1.575 m)    Body mass index is 35.19 kg/m.   Physical Exam Vitals and nursing note reviewed.  Constitutional:      Appearance: Normal appearance. She is well-developed.  HENT:     Head: Normocephalic and atraumatic.     Nose: Nose normal.     Mouth/Throat:     Mouth: Mucous membranes are moist.     Pharynx: Oropharynx is clear.  Eyes:     Extraocular Movements: Extraocular movements intact.     Conjunctiva/sclera: Conjunctivae normal.     Pupils: Pupils are equal, round, and reactive to light.  Cardiovascular:     Rate and Rhythm: Normal rate and regular rhythm.     Pulses: Normal pulses.     Heart sounds: Normal heart sounds.  Pulmonary:     Effort: Pulmonary effort is normal.     Breath sounds: Normal breath sounds.  Abdominal:     Palpations: Abdomen is soft.  Musculoskeletal:        General: Normal range of motion.     Cervical back: Normal range of motion and neck supple.  Lymphadenopathy:     Cervical: No cervical adenopathy.  Skin:    General: Skin is warm and dry.     Capillary Refill: Capillary refill takes less than 2 seconds.  Neurological:     General: No focal deficit present.     Mental Status: She is alert and oriented to person, place, and time.  Psychiatric:        Attention and Perception: Attention and perception normal.        Mood and Affect: Mood is depressed. Affect is tearful.        Speech: Speech normal.        Behavior: Behavior normal. Behavior is cooperative.        Thought Content: Thought content normal.        Cognition and Memory: Cognition and memory normal.        Judgment: Judgment normal.         Assessment & Plan:  1. Essential hypertension Blood  pressure elevated. Currently controlled through diet and exercise. Reassess in two months.   2. Encounter for screening mammogram for malignant neoplasm of breast Screening mammogram ordered today  - MM DIGITAL SCREENING BILATERAL; Future  3. Screening for colon cancer .refer to  GI for screening colonoscopy   - Ambulatory referral to Gastroenterology  4. Encounter to establish care Appointment today to establish new primary care provider      Problem List Items Addressed This Visit       Cardiovascular and Mediastinum   Essential hypertension - Primary   Other Visit Diagnoses     Encounter for screening mammogram for malignant neoplasm of breast       Relevant Orders   MM DIGITAL SCREENING BILATERAL   Screening for colon cancer       Relevant Orders   Ambulatory referral to Gastroenterology   Encounter to establish care           Return in about 2 months (around 04/27/2022) for health maintenance exam, with pap.   Ronnell Freshwater, NP

## 2022-03-01 ENCOUNTER — Other Ambulatory Visit: Payer: Self-pay

## 2022-03-01 ENCOUNTER — Telehealth: Payer: Self-pay

## 2022-03-01 DIAGNOSIS — Z1211 Encounter for screening for malignant neoplasm of colon: Secondary | ICD-10-CM

## 2022-03-01 MED ORDER — NA SULFATE-K SULFATE-MG SULF 17.5-3.13-1.6 GM/177ML PO SOLN: 354.0000 mL | Freq: Once | ORAL | 0 refills | Status: AC

## 2022-03-01 NOTE — Telephone Encounter (Signed)
Gastroenterology Pre-Procedure Review  Request Date: 03/28/2022 Requesting Physician: Dr. Marius Ditch  PATIENT REVIEW QUESTIONS: The patient responded to the following health history questions as indicated:    1. Are you having any GI issues? no 2. Do you have a personal history of Polyps? no 3. Do you have a family history of Colon Cancer or Polyps? Paternal Uncle colon cancer  4. Diabetes Mellitus? no 5. Joint replacements in the past 12 months?no 6. Major health problems in the past 3 months?no 7. Any artificial heart valves, MVP, or defibrillator?no    MEDICATIONS & ALLERGIES:    Patient reports the following regarding taking any anticoagulation/antiplatelet therapy:   Plavix, Coumadin, Eliquis, Xarelto, Lovenox, Pradaxa, Brilinta, or Effient? no Aspirin? no  Patient confirms/reports the following medications:  No current outpatient medications on file.   No current facility-administered medications for this visit.    Patient confirms/reports the following allergies:  No Known Allergies  No orders of the defined types were placed in this encounter.   AUTHORIZATION INFORMATION Primary Insurance: 1D#: Group #:  Secondary Insurance: 1D#: Group #:  SCHEDULE INFORMATION: Date:  Time: Location:

## 2022-03-15 ENCOUNTER — Telehealth: Payer: Self-pay

## 2022-03-15 NOTE — Telephone Encounter (Signed)
Dr. Marius Ditch is not going to be in the office on 03/28/2022. We are needing to reschedule patient colonoscopy. Called patient and reschedule colonoscopy to 04/11/2022. Sent new instructions to patient. Called ENDO and talk to trish to reschedule colonoscopy.

## 2022-03-31 ENCOUNTER — Telehealth: Payer: Self-pay

## 2022-03-31 NOTE — Telephone Encounter (Signed)
Patient called because she is needing to reschedule her colonoscopy for 04/11/2022 because she has to go out of town for work. Reschedule to 05/02/2022. Called endo and talk to [facility] to reschedule

## 2022-04-29 NOTE — Anesthesia Preprocedure Evaluation (Signed)
Anesthesia Evaluation  Patient identified by MRN, date of birth, ID band Patient awake    Reviewed: Allergy & Precautions, NPO status , Patient's Chart, lab work & pertinent test results  History of Anesthesia Complications (+) PONV and history of anesthetic complications  Airway Mallampati: III   Neck ROM: Full    Dental  (+) Missing, Implants   Pulmonary Current Smoker (5 cigarettes per day)Patient did not abstain from smoking. Current vaping   Pulmonary exam normal breath sounds clear to auscultation       Cardiovascular Exercise Tolerance: Good negative cardio ROS Normal cardiovascular exam Rhythm:Regular Rate:Normal     Neuro/Psych  PSYCHIATRIC DISORDERS Anxiety     Rare marijuana use    GI/Hepatic negative GI ROS,,,  Endo/Other  negative endocrine ROS    Renal/GU negative Renal ROS     Musculoskeletal  (+) Arthritis ,    Abdominal   Peds  Hematology negative hematology ROS (+)   Anesthesia Other Findings   Reproductive/Obstetrics                             Anesthesia Physical Anesthesia Plan  ASA: 2  Anesthesia Plan: General   Post-op Pain Management:    Induction: Intravenous  PONV Risk Score and Plan: 3 and Propofol infusion, TIVA and Treatment may vary due to age or medical condition  Airway Management Planned: Natural Airway  Additional Equipment:   Intra-op Plan:   Post-operative Plan:   Informed Consent: I have reviewed the patients History and Physical, chart, labs and discussed the procedure including the risks, benefits and alternatives for the proposed anesthesia with the patient or authorized representative who has indicated his/her understanding and acceptance.       Plan Discussed with: CRNA  Anesthesia Plan Comments: (LMA/GETA backup discussed.  Patient consented for risks of anesthesia including but not limited to:  - adverse reactions to  medications - damage to eyes, teeth, lips or other oral mucosa - nerve damage due to positioning  - sore throat or hoarseness - damage to heart, brain, nerves, lungs, other parts of body or loss of life  Informed patient about role of CRNA in peri- and intra-operative care.  Patient voiced understanding.)       Anesthesia Quick Evaluation

## 2022-05-02 ENCOUNTER — Encounter
Admission: RE | Disposition: A | Discharge status: Home / Self Care | Payer: Self-pay | Source: Ambulatory Visit | Attending: Gastroenterology

## 2022-05-02 ENCOUNTER — Ambulatory Visit: Payer: 59 | Admitting: Anesthesiology

## 2022-05-02 ENCOUNTER — Ambulatory Visit
Admission: RE | Admit: 2022-05-02 | Discharge: 2022-05-02 | Disposition: A | Discharge status: Home / Self Care | Payer: 59 | Source: Ambulatory Visit | Attending: Gastroenterology | Admitting: Gastroenterology

## 2022-05-02 ENCOUNTER — Encounter: Payer: Self-pay | Admitting: Gastroenterology

## 2022-05-02 DIAGNOSIS — F17210 Nicotine dependence, cigarettes, uncomplicated: Secondary | ICD-10-CM | POA: Insufficient documentation

## 2022-05-02 DIAGNOSIS — K5730 Diverticulosis of large intestine without perforation or abscess without bleeding: Secondary | ICD-10-CM | POA: Diagnosis not present

## 2022-05-02 DIAGNOSIS — D125 Benign neoplasm of sigmoid colon: Secondary | ICD-10-CM | POA: Diagnosis not present

## 2022-05-02 DIAGNOSIS — D122 Benign neoplasm of ascending colon: Secondary | ICD-10-CM

## 2022-05-02 DIAGNOSIS — D123 Benign neoplasm of transverse colon: Secondary | ICD-10-CM

## 2022-05-02 DIAGNOSIS — F17290 Nicotine dependence, other tobacco product, uncomplicated: Secondary | ICD-10-CM | POA: Insufficient documentation

## 2022-05-02 DIAGNOSIS — Z1211 Encounter for screening for malignant neoplasm of colon: Secondary | ICD-10-CM | POA: Diagnosis not present

## 2022-05-02 HISTORY — PX: COLONOSCOPY WITH PROPOFOL: SHX5780

## 2022-05-02 SURGERY — COLONOSCOPY WITH PROPOFOL
Anesthesia: General

## 2022-05-02 MED ORDER — PROPOFOL 10 MG/ML IV BOLUS
INTRAVENOUS | Status: DC | PRN
  Administered 2022-05-02: 90 mg via INTRAVENOUS
  Administered 2022-05-02: 10 mg via INTRAVENOUS

## 2022-05-02 MED ORDER — LIDOCAINE 2% (20 MG/ML) 5 ML SYRINGE
INTRAMUSCULAR | Status: DC | PRN
  Administered 2022-05-02: 50 mg via INTRAVENOUS

## 2022-05-02 MED ORDER — PROPOFOL 1000 MG/100ML IV EMUL
INTRAVENOUS | Status: AC
  Filled 2022-05-02: qty 100

## 2022-05-02 MED ORDER — SODIUM CHLORIDE 0.9 % IV SOLN
INTRAVENOUS | Status: DC
  Administered 2022-05-02: 20 mL/h via INTRAVENOUS

## 2022-05-02 MED ORDER — PROPOFOL 500 MG/50ML IV EMUL
INTRAVENOUS | Status: DC | PRN
  Administered 2022-05-02: 200 ug/kg/min via INTRAVENOUS

## 2022-05-02 NOTE — Transfer of Care (Signed)
Immediate Anesthesia Transfer of Care Note  Patient: Taylor Morris  Procedure(s) Performed: COLONOSCOPY WITH PROPOFOL  Patient Location: Endoscopy Unit  Anesthesia Type:General  Level of Consciousness: awake, alert , and oriented  Airway & Oxygen Therapy: Patient Spontanous Breathing  Post-op Assessment: Report given to RN and Post -op Vital signs reviewed and stable  Post vital signs: Reviewed and stable  Last Vitals:  Vitals Value Taken Time  BP 113/101 05/02/22 0846  Temp    Pulse 73 05/02/22 0847  Resp 19 05/02/22 0847  SpO2 100 % 05/02/22 0847  Vitals shown include unvalidated device data.  Last Pain:  Vitals:   05/02/22 0845  TempSrc:   PainSc: 0-No pain         Complications: No notable events documented.

## 2022-05-02 NOTE — Op Note (Signed)
[facility] Gastroenterology Patient Name: Taylor Morris Procedure Date: 05/02/2022 7:09 AM MRN: [id number] Account #: [id number] Date of Birth: March 31, 1965 Admit Type: Outpatient Age: 57 Room: [facility] ENDO ROOM 4 Gender: Female Note Status: Finalized Instrument Name: Park Meo [id number] Procedure:             Colonoscopy Indications:           Screening for colorectal malignant neoplasm, This is                         the patient's first colonoscopy Providers:             Lin Landsman MD, MD Referring MD:          No Local Md, MD (Referring MD) Medicines:             General Anesthesia Complications:         No immediate complications. Estimated blood loss: None. Procedure:             Pre-Anesthesia Assessment:                        - Prior to the procedure, a History and Physical was                         performed, and patient medications and allergies were                         reviewed. The patient is competent. The risks and                         benefits of the procedure and the sedation options and                         risks were discussed with the patient. All questions                         were answered and informed consent was obtained.                         Patient identification and proposed procedure were                         verified by the physician, the nurse, the                         anesthesiologist, the anesthetist and the technician                         in the pre-procedure area in the procedure room in the                         endoscopy suite. Mental Status Examination: alert and                         oriented. Airway Examination: normal oropharyngeal                         airway and neck mobility. Respiratory Examination:  clear to auscultation. CV Examination: normal.                         Prophylactic Antibiotics: The patient does not require                         prophylactic  antibiotics. Prior Anticoagulants: The                         patient has taken no anticoagulant or antiplatelet                         agents. ASA Grade Assessment: II - A patient with mild                         systemic disease. After reviewing the risks and                         benefits, the patient was deemed in satisfactory                         condition to undergo the procedure. The anesthesia                         plan was to use general anesthesia. Immediately prior                         to administration of medications, the patient was                         re-assessed for adequacy to receive sedatives. The                         heart rate, respiratory rate, oxygen saturations,                         blood pressure, adequacy of pulmonary ventilation, and                         response to care were monitored throughout the                         procedure. The physical status of the patient was                         re-assessed after the procedure.                        After obtaining informed consent, the colonoscope was                         passed under direct vision. Throughout the procedure,                         the patient's blood pressure, pulse, and oxygen                         saturations were monitored continuously. The  Colonoscope was introduced through the anus and                         advanced to the the cecum, identified by appendiceal                         orifice and ileocecal valve. The colonoscopy was                         performed without difficulty. The patient tolerated                         the procedure well. The quality of the bowel                         preparation was adequate to identify polyps greater                         than 5 mm in size. The ileocecal valve, appendiceal                         orifice, and rectum were photographed. Findings:      The perianal and digital rectal  examinations were normal. Pertinent       negatives include normal sphincter tone and no palpable rectal lesions.      A 5 mm polyp was found in the ascending colon. The polyp was sessile.       The polyp was removed with a cold snare. Resection and retrieval were       complete.      A 25 mm polyp was found in the transverse colon. The polyp was sessile.       Preparations were made for mucosal resection. Demarcation of the lesion       was performed with narrow band imaging to clearly identify the       boundaries of the lesion. Eleview was injected to raise the lesion.       Snare mucosal resection was performed. Resection and retrieval were       complete. Resected tissue including tissue margins will be examined by       histology. To prevent bleeding after mucosal resection, four hemostatic       clips were successfully placed. Clip manufacturer: [employer].       There was no bleeding during, or at the end, of the procedure. Estimated       blood loss: none.      A 9 mm polyp was found in the transverse colon. The polyp was sessile.       The polyp was removed with a hot snare. Resection and retrieval were       complete.      Two sessile polyps were found in the transverse colon. The polyps were 6       to 7 mm in size. These polyps were removed with a cold snare. Resection       and retrieval were complete. Estimated blood loss: none.      A 9 mm polyp was found in the sigmoid colon. The polyp was pedunculated.       The polyp was removed with a hot snare. Resection and retrieval were       complete.  The retroflexed view of the distal rectum and anal verge was normal and       showed no anal or rectal abnormalities.      A few large-mouthed diverticula were found in the sigmoid colon and       descending colon. Impression:            - One 5 mm polyp in the ascending colon, removed with                         a cold snare. Resected and retrieved.                         - One 25 mm polyp in the transverse colon, removed                         with mucosal resection. Resected and retrieved. Clips                         were placed. Clip manufacturer: [employer].                        - One 9 mm polyp in the transverse colon, removed with                         a hot snare. Resected and retrieved.                        - Two 6 to 7 mm polyps in the transverse colon,                         removed with a cold snare. Resected and retrieved.                        - One 9 mm polyp in the sigmoid colon, removed with a                         hot snare. Resected and retrieved.                        - The distal rectum and anal verge are normal on                         retroflexion view.                        - Diverticulosis in the sigmoid colon and in the                         descending colon.                        - Mucosal resection was performed. Resection and                         retrieval were complete. Recommendation:        - Discharge patient to home (with escort).                        -  Resume previous diet today.                        - Continue present medications.                        - Await pathology results.                        - Repeat colonoscopy in 1 year with 2 day prep for                         surveillance of multiple polyps and fair prep. Procedure Code(s):     --- Professional ---                        [phone], Colonoscopy, flexible; with endoscopic mucosal                         resection                        [phone], 64, Colonoscopy, flexible; with removal of                         tumor(s), polyp(s), or other lesion(s) by snare                         technique Diagnosis Code(s):     --- Professional ---                        Z12.11, Encounter for screening for malignant neoplasm                         of colon                        D12.2, Benign neoplasm of ascending colon                         D12.3, Benign neoplasm of transverse colon (hepatic                         flexure or splenic flexure)                        D12.5, Benign neoplasm of sigmoid colon                        K57.30, Diverticulosis of large intestine without                         perforation or abscess without bleeding CPT copyright 2022 American [employer] Association. All rights reserved. The codes documented in this report are preliminary and upon coder review may  be revised to meet current compliance requirements. Dr. Ulyess Mort Lin Landsman MD, MD 05/02/2022 8:43:49 AM This report has been signed electronically. Number of Addenda: 0 Note Initiated On: 05/02/2022 7:09 AM Scope Withdrawal Time: 0 hours 40 minutes 59 seconds  Total Procedure Duration: 0 hours 44 minutes 7 seconds  Estimated Blood Loss:  Estimated blood loss: none.  [facility]

## 2022-05-02 NOTE — H&P (Signed)
Cephas Darby, MD [address]  [place]  [place], [place] [address]  Main: [phone]  Fax: [phone] Pager: [phone]  Primary Care Physician:  Ronnell Freshwater, NP Primary Gastroenterologist:  Dr. Cephas Darby  Pre-Procedure History & Physical: HPI:  Taylor Morris is a 58 y.o. female is here for an colonoscopy.   Past Medical History:  Diagnosis Date   Anxiety    Arthritis    Complication of anesthesia    H/O cesarean section    times 2   PONV (postoperative nausea and vomiting)     Past Surgical History:  Procedure Laterality Date   ABDOMINAL HYSTERECTOMY     BREAST BIOPSY Right 07/2014   surgery to remove abcess 3:00/ Dr Marina Gravel   BREAST DUCTAL SYSTEM EXCISION Right 07/22/2014   Procedure: right breast major duct excision ;  Surgeon: Sherri Rad, MD;  Location: [facility] ORS;  Service: General;  Laterality: Right;   BREAST EXCISIONAL BIOPSY     CESAREAN SECTION     times 2   CHOLECYSTECTOMY     INCISION AND DRAINAGE BREAST ABSCESS Right 05/16   TUBAL LIGATION      Prior to Admission medications   Not on File    Allergies as of 03/01/2022   (No Known Allergies)    Family History  Problem Relation Age of Onset   Hypertension Mother    High Cholesterol Mother    Hypertension Father    High Cholesterol Father    Breast cancer Maternal Aunt 68   Cancer Maternal Aunt     Social History   Socioeconomic History   Marital status: Widowed    Spouse name: Not on file   Number of children: Not on file   Years of education: Not on file   Highest education level: Not on file  Occupational History   Not on file  Tobacco Use   Smoking status: Some Days    Packs/day: 0.25    Years: 30.00    Total pack years: 7.50    Types: Cigarettes   Smokeless tobacco: Never  Vaping Use   Vaping Use: Never used  Substance and Sexual Activity   Alcohol use: Yes    Comment: ocassionally   Drug use: Yes    Types: Marijuana   Sexual activity: Not Currently   Other Topics Concern   Not on file  Social History Narrative   Not on file   Social Determinants of Health   Financial Resource Strain: Not on file  Food Insecurity: Not on file  Transportation Needs: Not on file  Physical Activity: Not on file  Stress: Not on file  Social Connections: Not on file  Intimate Partner Violence: Not on file    Review of Systems: See HPI, otherwise negative ROS  Physical Exam: BP (!) 135/113   Pulse 72   Temp (!) 96.7 F (35.9 C) (Temporal)   Resp 20   Ht 5' 3"$  (1.6 m)   Wt 86.4 kg   SpO2 100%   BMI 33.73 kg/m  General:   Alert,  pleasant and cooperative in NAD Head:  Normocephalic and atraumatic. Neck:  Supple; no masses or thyromegaly. Lungs:  Clear throughout to auscultation.    Heart:  Regular rate and rhythm. Abdomen:  Soft, nontender and nondistended. Normal bowel sounds, without guarding, and without rebound.   Neurologic:  Alert and  oriented x4;  grossly normal neurologically.  Impression/Plan: Taylor Morris is here for an colonoscopy to be  performed for colon cancer screening  Risks, benefits, limitations, and alternatives regarding  colonoscopy have been reviewed with the patient.  Questions have been answered.  All parties agreeable.   Sherri Sear, MD  05/02/2022, 7:43 AM

## 2022-05-02 NOTE — Anesthesia Postprocedure Evaluation (Signed)
Anesthesia Post Note  Patient: MAYRENE MEGGINSON  Procedure(s) Performed: COLONOSCOPY WITH PROPOFOL  Patient location during evaluation: PACU Anesthesia Type: General Level of consciousness: awake and alert, oriented and patient cooperative Pain management: pain level controlled Vital Signs Assessment: post-procedure vital signs reviewed and stable Respiratory status: spontaneous breathing, nonlabored ventilation and respiratory function stable Cardiovascular status: blood pressure returned to baseline and stable Postop Assessment: adequate PO intake Anesthetic complications: no   No notable events documented.   Last Vitals:  Vitals:   05/02/22 0907 05/02/22 0915  BP: (!) 138/92   Pulse:  63  Resp:    Temp:    SpO2:  99%    Last Pain:  Vitals:   05/02/22 0915  TempSrc:   PainSc: 0-No pain                 Darrin Nipper

## 2022-05-03 ENCOUNTER — Encounter: Payer: Self-pay | Admitting: Gastroenterology

## 2022-05-03 LAB — SURGICAL PATHOLOGY

## 2022-06-20 NOTE — Progress Notes (Signed)
Complete physical exam   Patient: Taylor Morris   DOB: May 24, 1965   57 y.o. Female  MRN: [id number] Visit Date: 06/21/2022    Chief Complaint  Patient presents with   Annual Exam   Gynecologic Exam   Subjective    Taylor Morris is a 57 y.o. female who presents today for a complete physical exam.  She reports consuming a  low carbohydrate  diet. Exercise is limited by orthopedic condition(s): back pain. She generally feels well. She does not have additional problems to discuss today.   HPI  Annual physical with pap smear  -hypertension  --controlled with diet and exercise -colonoscopy done 05/02/2022 --multiple large polyps found.  --recommended to repeat colonoscopy in one year.  -concern about weight gain.  -having hot flashes and night sweats. Irritability. Mood swings.  -? Mammogram  -due to have routine, fasting labs   Patient is an everyday smoker -has been transitioning to Vaping.  Currently only smoking 5 cigarettes per day but using Vape much more often.   She denies chest pain, chest pressure, or shortness of breath. She denies headaches or visual disturbances. She denies abdominal pain, nausea, vomiting, or changes in bowel or bladder habits.     Past Medical History:  Diagnosis Date   Anxiety    Arthritis    Complication of anesthesia    H/O cesarean section    times 2   PONV (postoperative nausea and vomiting)    Past Surgical History:  Procedure Laterality Date   ABDOMINAL HYSTERECTOMY     BREAST BIOPSY Right 07/2014   surgery to remove abcess 3:00/ Dr Egbert Garibaldi   BREAST DUCTAL SYSTEM EXCISION Right 07/22/2014   Procedure: right breast major duct excision ;  Surgeon: Natale Lay, MD;  Location: [facility] ORS;  Service: General;  Laterality: Right;   BREAST EXCISIONAL BIOPSY     CESAREAN SECTION     times 2   CHOLECYSTECTOMY     COLONOSCOPY WITH PROPOFOL N/A 05/02/2022   Procedure: COLONOSCOPY WITH PROPOFOL;  Surgeon: Toney Reil, MD;  Location: [facility]  ENDOSCOPY;  Service: Gastroenterology;  Laterality: N/A;   INCISION AND DRAINAGE BREAST ABSCESS Right 05/16   TUBAL LIGATION     Social History   Socioeconomic History   Marital status: Widowed    Spouse name: Not on file   Number of children: Not on file   Years of education: Not on file   Highest education level: Not on file  Occupational History   Not on file  Tobacco Use   Smoking status: Some Days    Packs/day: 0.25    Years: 30.00    Additional pack years: 0.00    Total pack years: 7.50    Types: Cigarettes   Smokeless tobacco: Never  Vaping Use   Vaping Use: Never used  Substance and Sexual Activity   Alcohol use: Yes    Comment: ocassionally   Drug use: Yes    Types: Marijuana   Sexual activity: Not Currently  Other Topics Concern   Not on file  Social History Narrative   Not on file   Social Determinants of Health   Financial Resource Strain: Not on file  Food Insecurity: Not on file  Transportation Needs: Not on file  Physical Activity: Not on file  Stress: Not on file  Social Connections: Not on file  Intimate Partner Violence: Not on file   Family Status  Relation Name Status   Mother  Alive   Father  Deceased   Mat Aunt  (Not Specified)   Family History  Problem Relation Age of Onset   Hypertension Mother    High Cholesterol Mother    Hypertension Father    High Cholesterol Father    Breast cancer Maternal Aunt 25   Cancer Maternal Aunt    No Known Allergies  Patient Care Team: Carlean Jews, NP as PCP - General (Family Medicine) Carlean Jews, NP as Nurse Practitioner (Family Medicine) Kieth Brightly, MD (General Surgery)   Medications: No outpatient medications prior to visit.   No facility-administered medications prior to visit.    Review of Systems See HPI      Objective     Today's Vitals   06/21/22 0920 06/21/22 0937  BP: (Abnormal) 142/84 (Abnormal) 158/95  Pulse: 77   SpO2: 99%   Weight: 195 lb  1.9 oz (88.5 kg)   Height: 5\' 3"  (1.6 m)    Body mass index is 34.56 kg/m.  BP Readings from Last 3 Encounters:  06/21/22 (Abnormal) 158/95  05/02/22 (Abnormal) 138/92  02/24/22 (Abnormal) 145/80    Wt Readings from Last 3 Encounters:  06/21/22 195 lb 1.9 oz (88.5 kg)  05/02/22 190 lb 6.4 oz (86.4 kg)  02/24/22 192 lb 6.4 oz (87.3 kg)     Physical Exam Vitals and nursing note reviewed. Exam conducted with a chaperone present.  Constitutional:      Appearance: Normal appearance. She is well-developed.  HENT:     Head: Normocephalic and atraumatic.     Right Ear: Tympanic membrane, ear canal and external ear normal.     Left Ear: Tympanic membrane, ear canal and external ear normal.     Nose: Nose normal.     Mouth/Throat:     Mouth: Mucous membranes are moist.     Pharynx: Oropharynx is clear.  Eyes:     Extraocular Movements: Extraocular movements intact.     Conjunctiva/sclera: Conjunctivae normal.     Pupils: Pupils are equal, round, and reactive to light.  Cardiovascular:     Rate and Rhythm: Normal rate and regular rhythm.     Pulses: Normal pulses.     Heart sounds: Normal heart sounds.  Pulmonary:     Effort: Pulmonary effort is normal.     Breath sounds: Normal breath sounds.  Abdominal:     General: Bowel sounds are normal. There is no distension.     Palpations: Abdomen is soft. There is no mass.     Tenderness: There is no abdominal tenderness. There is no right CVA tenderness, left CVA tenderness, guarding or rebound.     Hernia: No hernia is present. There is no hernia in the left inguinal area or right inguinal area.  Genitourinary:    General: Normal vulva.     Exam position: Supine.     Labia:        Right: No rash, tenderness, lesion or injury.        Left: No rash, tenderness, lesion or injury.      Vagina: No signs of injury and foreign body. No vaginal discharge, erythema, tenderness, bleeding, lesions or prolapsed vaginal walls.     Cervix: No  cervical motion tenderness, discharge, friability, lesion, erythema, cervical bleeding or eversion.     Uterus: Not deviated, not enlarged, not fixed, not tender and no uterine prolapse.      Adnexa: Right adnexa normal and left adnexa normal.     Comments: No tenderness, masses, or organomeglay  present during bimanual exam .   Musculoskeletal:        General: Normal range of motion.     Cervical back: Normal range of motion and neck supple.  Lymphadenopathy:     Cervical: No cervical adenopathy.     Lower Body: No right inguinal adenopathy. No left inguinal adenopathy.  Skin:    General: Skin is warm and dry.     Capillary Refill: Capillary refill takes less than 2 seconds.  Neurological:     General: No focal deficit present.     Mental Status: She is alert and oriented to person, place, and time.  Psychiatric:        Mood and Affect: Mood normal.        Behavior: Behavior normal.        Thought Content: Thought content normal.        Judgment: Judgment normal.     Last depression screening scores   Row Labels 06/21/2022    9:24 AM 02/24/2022   11:29 AM 10/26/2018   11:39 AM  PHQ 2/9 Scores   Section Header. No data exists in this row.     PHQ - 2 Score   2 2 0  PHQ- 9 Score   13 7    Last fall risk screening   Row Labels 02/24/2022   11:30 AM  Fall Risk    Section Header. No data exists in this row.   Falls in the past year?   0  Number falls in past yr:   0  Injury with Fall?   0  Follow up   Falls evaluation completed   Last Audit-C alcohol use screening   Row Labels 10/26/2018   11:37 AM  Alcohol Use Disorder Test (AUDIT)   Section Header. No data exists in this row.   1. How often do you have a drink containing alcohol?   1  2. How many drinks containing alcohol do you have on a typical day when you are drinking?   0  3. How often do you have six or more drinks on one occasion?   0  AUDIT-C Score   1   A score of 3 or more in women, and 4 or more in men  indicates increased risk for alcohol abuse, EXCEPT if all of the points are from question 1   Results for orders placed or performed in visit on 06/21/22  CBC  Result Value Ref Range   WBC 9.1 3.4 - 10.8 x10E3/uL   RBC 4.86 3.77 - 5.28 x10E6/uL   Hemoglobin 14.9 11.1 - 15.9 g/dL   Hematocrit 16.1 09.6 - 46.6 %   MCV 91 79 - 97 fL   MCH 30.7 26.6 - 33.0 pg   MCHC 33.7 31.5 - 35.7 g/dL   RDW 04.5 40.9 - 81.1 %   Platelets 249 150 - 450 x10E3/uL  Comprehensive metabolic panel  Result Value Ref Range   Glucose 91 70 - 99 mg/dL   BUN 10 6 - 24 mg/dL   Creatinine, Ser 9.14 0.57 - 1.00 mg/dL   eGFR 89 >78 GN/FAO/1.30   BUN/Creatinine Ratio 13 9 - 23   Sodium 139 134 - 144 mmol/L   Potassium 4.2 3.5 - 5.2 mmol/L   Chloride 103 96 - 106 mmol/L   CO2 23 20 - 29 mmol/L   Calcium 9.5 8.7 - 10.2 mg/dL   Total Protein 6.9 6.0 - 8.5 g/dL   Albumin 4.5 3.8 - 4.9  g/dL   Globulin, Total 2.4 1.5 - 4.5 g/dL   Albumin/Globulin Ratio 1.9 1.2 - 2.2   Bilirubin Total 0.3 0.0 - 1.2 mg/dL   Alkaline Phosphatase 108 44 - 121 IU/L   AST 19 0 - 40 IU/L   ALT 17 0 - 32 IU/L  Lipid panel  Result Value Ref Range   Cholesterol, Total 291 (H) 100 - 199 mg/dL   Triglycerides 161 (H) 0 - 149 mg/dL   HDL 50 >09 mg/dL   VLDL Cholesterol Cal 30 5 - 40 mg/dL   LDL Chol Calc (NIH) 604 (H) 0 - 99 mg/dL   Lipid Comment: Comment    Chol/HDL Ratio 5.8 (H) 0.0 - 4.4 ratio  Hemoglobin A1c  Result Value Ref Range   Hgb A1c MFr Bld 5.5 4.8 - 5.6 %   Est. average glucose Bld gHb Est-mCnc 111 mg/dL  VITAMIN D 25 Hydroxy (Vit-D Deficiency, Fractures)  Result Value Ref Range   Vit D, 25-Hydroxy 14.6 (L) 30.0 - 100.0 ng/mL  TSH + free T4  Result Value Ref Range   TSH 1.200 0.450 - 4.500 uIU/mL   Free T4 1.12 0.82 - 1.77 ng/dL  Cytology - PAP( [place])  Result Value Ref Range   High risk HPV Negative    Adequacy      Satisfactory for evaluation; transformation zone component ABSENT.   Diagnosis      -  Negative for intraepithelial lesion or malignancy (NILM)   Comment Normal Reference Range HPV - Negative     Assessment & Plan        There is no immunization history on file for this patient.  Health Maintenance  Topic Date Due   COVID-19 Vaccine (1) Never done   HIV Screening  Never done   Hepatitis C Screening  Never done   DTaP/Tdap/Td (1 - Tdap) Never done   MAMMOGRAM  01/14/2021   Zoster Vaccines- Shingrix (1 of 2) 09/20/2022 (Originally 04/23/2016)   INFLUENZA VACCINE  10/13/2022   COLONOSCOPY (Pts 45-60yrs Insurance coverage will need to be confirmed)  05/03/2023   PAP SMEAR-Modifier  06/20/2025   HPV VACCINES  Aged Out    Discussed health benefits of physical activity, and encouraged her to engage in regular exercise appropriate for her age and condition.    Return in about 3 months (around 09/20/2022) for blood pressure, menopausal symptoms.        Carlean Jews, NP  [facility] [phone] (phone) [phone] (fax)  [facility]

## 2022-06-21 ENCOUNTER — Ambulatory Visit (INDEPENDENT_AMBULATORY_CARE_PROVIDER_SITE_OTHER): Payer: 59 | Admitting: Nurse Practitioner

## 2022-06-21 ENCOUNTER — Other Ambulatory Visit (HOSPITAL_COMMUNITY)
Admission: RE | Admit: 2022-06-21 | Discharge: 2022-06-21 | Disposition: A | Discharge status: Home / Self Care | Payer: 59 | Source: Ambulatory Visit | Attending: Nurse Practitioner | Admitting: Nurse Practitioner

## 2022-06-21 ENCOUNTER — Encounter: Payer: Self-pay | Admitting: Nurse Practitioner

## 2022-06-21 VITALS — BP 158/95 | HR 77 | Ht 63.0 in | Wt 195.1 lb

## 2022-06-21 DIAGNOSIS — R5383 Other fatigue: Secondary | ICD-10-CM

## 2022-06-21 DIAGNOSIS — E88819 Insulin resistance, unspecified: Secondary | ICD-10-CM | POA: Diagnosis not present

## 2022-06-21 DIAGNOSIS — E559 Vitamin D deficiency, unspecified: Secondary | ICD-10-CM

## 2022-06-21 DIAGNOSIS — F411 Generalized anxiety disorder: Secondary | ICD-10-CM

## 2022-06-21 DIAGNOSIS — Z01419 Encounter for gynecological examination (general) (routine) without abnormal findings: Secondary | ICD-10-CM

## 2022-06-21 DIAGNOSIS — R635 Abnormal weight gain: Secondary | ICD-10-CM

## 2022-06-21 DIAGNOSIS — I10 Essential (primary) hypertension: Secondary | ICD-10-CM

## 2022-06-21 DIAGNOSIS — N951 Menopausal and female climacteric states: Secondary | ICD-10-CM | POA: Diagnosis not present

## 2022-06-21 DIAGNOSIS — E785 Hyperlipidemia, unspecified: Secondary | ICD-10-CM

## 2022-06-21 MED ORDER — PAROXETINE HCL 10 MG PO TABS: 10.0000 mg | ORAL_TABLET | Freq: Every day | ORAL | 3 refills | Status: DC

## 2022-06-21 MED ORDER — HYDROCHLOROTHIAZIDE 25 MG PO TABS: 25.0000 mg | ORAL_TABLET | Freq: Every day | ORAL | 1 refills | Status: DC

## 2022-06-23 LAB — CYTOLOGY - PAP
Adequacy: ABSENT
Comment: NEGATIVE
Diagnosis: NEGATIVE
High risk HPV: NEGATIVE

## 2022-06-25 LAB — COMPREHENSIVE METABOLIC PANEL
ALT: 17 IU/L (ref 0–32)
AST: 19 IU/L (ref 0–40)
Albumin/Globulin Ratio: 1.9 (ref 1.2–2.2)
Albumin: 4.5 g/dL (ref 3.8–4.9)
Alkaline Phosphatase: 108 IU/L (ref 44–121)
BUN/Creatinine Ratio: 13 (ref 9–23)
BUN: 10 mg/dL (ref 6–24)
Bilirubin Total: 0.3 mg/dL (ref 0.0–1.2)
CO2: 23 mmol/L (ref 20–29)
Calcium: 9.5 mg/dL (ref 8.7–10.2)
Chloride: 103 mmol/L (ref 96–106)
Creatinine, Ser: 0.78 mg/dL (ref 0.57–1.00)
Globulin, Total: 2.4 g/dL (ref 1.5–4.5)
Glucose: 91 mg/dL (ref 70–99)
Potassium: 4.2 mmol/L (ref 3.5–5.2)
Sodium: 139 mmol/L (ref 134–144)
Total Protein: 6.9 g/dL (ref 6.0–8.5)
eGFR: 89 mL/min/{1.73_m2} (ref >59)

## 2022-06-25 LAB — CBC
Hematocrit: 44.2 % (ref 34.0–46.6)
Hemoglobin: 14.9 g/dL (ref 11.1–15.9)
MCH: 30.7 pg (ref 26.6–33.0)
MCHC: 33.7 g/dL (ref 31.5–35.7)
MCV: 91 fL (ref 79–97)
Platelets: 249 10*3/uL (ref 150–450)
RBC: 4.86 x10E6/uL (ref 3.77–5.28)
RDW: 13 % (ref 11.7–15.4)
WBC: 9.1 10*3/uL (ref 3.4–10.8)

## 2022-06-25 LAB — LIPID PANEL
Chol/HDL Ratio: 5.8 ratio — ABNORMAL HIGH (ref 0.0–4.4)
Cholesterol, Total: 291 mg/dL — ABNORMAL HIGH (ref 100–199)
HDL: 50 mg/dL (ref >39)
LDL Chol Calc (NIH): 211 mg/dL — ABNORMAL HIGH (ref 0–99)
Triglycerides: 159 mg/dL — ABNORMAL HIGH (ref 0–149)
VLDL Cholesterol Cal: 30 mg/dL (ref 5–40)

## 2022-06-25 LAB — HEMOGLOBIN A1C
Est. average glucose Bld gHb Est-mCnc: 111 mg/dL
Hgb A1c MFr Bld: 5.5 % (ref 4.8–5.6)

## 2022-06-25 LAB — TSH+FREE T4
Free T4: 1.12 ng/dL (ref 0.82–1.77)
TSH: 1.2 u[IU]/mL (ref 0.450–4.500)

## 2022-06-25 LAB — VITAMIN D 25 HYDROXY (VIT D DEFICIENCY, FRACTURES): Vit D, 25-Hydroxy: 14.6 ng/mL — ABNORMAL LOW (ref 30.0–100.0)

## 2022-07-25 DIAGNOSIS — E559 Vitamin D deficiency, unspecified: Secondary | ICD-10-CM | POA: Insufficient documentation

## 2022-07-25 DIAGNOSIS — R5383 Other fatigue: Secondary | ICD-10-CM | POA: Insufficient documentation

## 2022-07-25 DIAGNOSIS — R635 Abnormal weight gain: Secondary | ICD-10-CM | POA: Insufficient documentation

## 2022-07-25 DIAGNOSIS — N951 Menopausal and female climacteric states: Secondary | ICD-10-CM | POA: Insufficient documentation

## 2022-07-25 DIAGNOSIS — E88819 Insulin resistance, unspecified: Secondary | ICD-10-CM | POA: Insufficient documentation

## 2022-07-25 DIAGNOSIS — E785 Hyperlipidemia, unspecified: Secondary | ICD-10-CM | POA: Insufficient documentation

## 2022-07-25 NOTE — Assessment & Plan Note (Addendum)
Restart HCTZ 25 mg daily  Advised she limit salt in the diet and increase daily water intake.  Monitor blood pressure daily.  She understands goal is to have blood pressure at or below 130/80  Reassess in 3 months

## 2022-07-25 NOTE — Assessment & Plan Note (Signed)
Check vitamin d level and treat deficiency as indicated.   

## 2022-07-25 NOTE — Assessment & Plan Note (Addendum)
Check labs with cbc and thyroid panel for further evaluation Trial paroxetine 10 mg daily  Reassess in 3 months .

## 2022-07-25 NOTE — Assessment & Plan Note (Addendum)
Trial paroxetine 10 mg daily  Reassess in 3 months.

## 2022-07-25 NOTE — Assessment & Plan Note (Signed)
Check labs with HgbA1c.  Liit CHO and sugar in diet.  Increase low-impact exercise  Treat as indicated

## 2022-07-25 NOTE — Assessment & Plan Note (Signed)
Check labs with thyroid panel for further evaluation.  

## 2022-07-25 NOTE — Assessment & Plan Note (Signed)
Routine, fasting labs drawn during today's visit.  Treat high cholesterol as indicated  

## 2022-09-12 NOTE — Progress Notes (Signed)
Vitamin D deficient.  Moderate elevation of LDL and total cholesterol.  Discussed labs at next office visit.  Drisdol weekly and encourage initiation of statin to lower cholesterol.  Other labs good.

## 2022-09-20 ENCOUNTER — Ambulatory Visit: Payer: 59 | Admitting: Nurse Practitioner

## 2022-09-27 ENCOUNTER — Ambulatory Visit: Payer: 59 | Admitting: Family Medicine

## 2022-09-27 ENCOUNTER — Encounter: Payer: Self-pay | Admitting: Family Medicine

## 2022-09-28 ENCOUNTER — Other Ambulatory Visit: Payer: Self-pay | Admitting: Cardiology

## 2023-03-27 ENCOUNTER — Ambulatory Visit: Payer: 59 | Admitting: Physician Assistant

## 2023-03-27 ENCOUNTER — Encounter: Payer: Self-pay | Admitting: Physician Assistant

## 2023-03-27 VITALS — BP 161/90 | HR 88 | Temp 98.2°F | Resp 16 | Ht 63.0 in | Wt 200.2 lb

## 2023-03-27 DIAGNOSIS — E559 Vitamin D deficiency, unspecified: Secondary | ICD-10-CM | POA: Diagnosis not present

## 2023-03-27 DIAGNOSIS — E538 Deficiency of other specified B group vitamins: Secondary | ICD-10-CM

## 2023-03-27 DIAGNOSIS — I10 Essential (primary) hypertension: Secondary | ICD-10-CM

## 2023-03-27 DIAGNOSIS — N951 Menopausal and female climacteric states: Secondary | ICD-10-CM | POA: Diagnosis not present

## 2023-03-27 DIAGNOSIS — E785 Hyperlipidemia, unspecified: Secondary | ICD-10-CM

## 2023-03-27 DIAGNOSIS — Z7689 Persons encountering health services in other specified circumstances: Secondary | ICD-10-CM

## 2023-03-27 DIAGNOSIS — Z1329 Encounter for screening for other suspected endocrine disorder: Secondary | ICD-10-CM

## 2023-03-27 DIAGNOSIS — R5383 Other fatigue: Secondary | ICD-10-CM

## 2023-03-27 DIAGNOSIS — Z131 Encounter for screening for diabetes mellitus: Secondary | ICD-10-CM

## 2023-03-27 MED ORDER — AMLODIPINE BESYLATE 2.5 MG PO TABS: 2.5000 mg | ORAL_TABLET | Freq: Every day | ORAL | 2 refills | Status: AC

## 2023-03-27 MED ORDER — HYDROCHLOROTHIAZIDE 25 MG PO TABS: 25.0000 mg | ORAL_TABLET | Freq: Every day | ORAL | 1 refills | Status: DC

## 2023-03-27 NOTE — Progress Notes (Signed)
 [facility] [address] [place], [place] [address]  Internal MEDICINE  Office Visit Note  Patient Name: Taylor Morris  [id number]  [id number]  Date of Service: 04/05/2023   Complaints/HPI Pt is here for establishment of PCP. Chief Complaint  Patient presents with   New Patient (Initial Visit)   Hypertension   Menopause   Weight Loss   HPI Pt is here to reestablish care -did go to health bar and got b12 recently -Went to [facility] and recommended cream vs pellet for HRT, but pt declines. Was hesitant to try any hormones -menopause sx for last few years, has been gaining weight and having night sweats -high risk breast cancer--has had 2 procedures on breasts before and told not to do estrogen therapy -partial hysterectomy, BG removed, 2 c-sections -states she has not been sexually active in the last 5 years -BP also problematic and has been staying elevated on current hydrochlorothiazide ; BP at home 150-160/90s. Recheck in office in 150/100 -UTD on colon screening, states going to restroom 3-4 times per day, small pellets not Diarrhea, sometimes after wiping too much then can get irritation with bright red on paper, none in stool.  -pap UTD -will check labs  Current Medication: Outpatient Encounter Medications as of 03/27/2023  Medication Sig   amLODipine  (NORVASC ) 2.5 MG tablet Take 1 tablet (2.5 mg total) by mouth daily.   [DISCONTINUED] hydrochlorothiazide  (HYDRODIURIL ) 25 MG tablet Take 1 tablet (25 mg total) by mouth daily.   [DISCONTINUED] PARoxetine  (PAXIL ) 10 MG tablet Take 1 tablet (10 mg total) by mouth daily.   hydrochlorothiazide  (HYDRODIURIL ) 25 MG tablet Take 1 tablet (25 mg total) by mouth daily.   No facility-administered encounter medications on file as of 03/27/2023.    Surgical History: Past Surgical History:  Procedure Laterality Date   ABDOMINAL HYSTERECTOMY     BREAST BIOPSY Right 07/2014   surgery to remove abcess 3:00/  Dr Eluterio   BREAST DUCTAL SYSTEM EXCISION Right 07/22/2014   Procedure: right breast major duct excision ;  Surgeon: Oneil Eluterio, MD;  Location: [facility] ORS;  Service: General;  Laterality: Right;   BREAST EXCISIONAL BIOPSY     CESAREAN SECTION     times 2   CHOLECYSTECTOMY     COLONOSCOPY WITH PROPOFOL  N/A 05/02/2022   Procedure: COLONOSCOPY WITH PROPOFOL ;  Surgeon: Unk Corinn Skiff, MD;  Location: [facility] ENDOSCOPY;  Service: Gastroenterology;  Laterality: N/A;   INCISION AND DRAINAGE BREAST ABSCESS Right 05/16   TUBAL LIGATION      Medical History: Past Medical History:  Diagnosis Date   Anxiety    Arthritis    Complication of anesthesia    H/O cesarean section    times 2   PONV (postoperative nausea and vomiting)     Family History: Family History  Problem Relation Age of Onset   Hypertension Mother    High Cholesterol Mother    Hypertension Father    High Cholesterol Father    Breast cancer Maternal Aunt 71   Cancer Maternal Aunt     Social History   Socioeconomic History   Marital status: Widowed    Spouse name: Not on file   Number of children: Not on file   Years of education: Not on file   Highest education level: Not on file  Occupational History   Not on file  Tobacco Use   Smoking status: Some Days    Current packs/day: 0.25    Average packs/day: 0.3 packs/day  for 30.0 years (7.5 ttl pk-yrs)    Types: Cigarettes   Smokeless tobacco: Never  Vaping Use   Vaping status: Every Day  Substance and Sexual Activity   Alcohol use: Yes    Comment: ocassionally   Drug use: Yes    Types: Marijuana   Sexual activity: Not Currently  Other Topics Concern   Not on file  Social History Narrative   Not on file   Social Drivers of Health   Financial Resource Strain: Not on file  Food Insecurity: Not on file  Transportation Needs: Not on file  Physical Activity: Not on file  Stress: Not on file  Social Connections: Not on file  Intimate Partner Violence: Not on  file     Review of Systems  Constitutional:  Negative for chills, fatigue and unexpected weight change.  HENT:  Positive for postnasal drip. Negative for congestion, rhinorrhea, sneezing and sore throat.   Eyes:  Negative for redness.  Respiratory:  Negative for cough, chest tightness and shortness of breath.   Cardiovascular:  Negative for chest pain and palpitations.  Gastrointestinal:  Negative for abdominal pain, constipation, diarrhea, nausea and vomiting.  Endocrine:       Night sweats  Genitourinary:  Negative for dysuria and frequency.  Musculoskeletal:  Negative for arthralgias, back pain, joint swelling and neck pain.  Skin:  Negative for rash.  Neurological: Negative.  Negative for tremors and numbness.  Hematological:  Negative for adenopathy. Does not bruise/bleed easily.  Psychiatric/Behavioral:  Positive for sleep disturbance. Negative for behavioral problems (Depression) and suicidal ideas. The patient is not nervous/anxious.     Vital Signs: BP (!) 161/90   Pulse 88   Temp 98.2 F (36.8 C)   Resp 16   Ht 5' 3 (1.6 m)   Wt 200 lb 3.2 oz (90.8 kg)   SpO2 97%   BMI 35.46 kg/m    Physical Exam Vitals and nursing note reviewed.  Constitutional:      General: She is not in acute distress.    Appearance: She is well-developed. She is not diaphoretic.  HENT:     Head: Normocephalic and atraumatic.     Mouth/Throat:     Pharynx: No oropharyngeal exudate.  Eyes:     Pupils: Pupils are equal, round, and reactive to light.  Neck:     Thyroid: No thyromegaly.     Vascular: No JVD.     Trachea: No tracheal deviation.  Cardiovascular:     Rate and Rhythm: Normal rate and regular rhythm.     Heart sounds: Normal heart sounds. No murmur heard.    No friction rub. No gallop.  Pulmonary:     Effort: Pulmonary effort is normal.  Chest:     Chest wall: No tenderness.  Abdominal:     General: Bowel sounds are normal.     Palpations: Abdomen is soft.   Musculoskeletal:        General: Normal range of motion.     Cervical back: Normal range of motion and neck supple.  Lymphadenopathy:     Cervical: No cervical adenopathy.  Skin:    General: Skin is warm and dry.  Neurological:     Mental Status: She is alert and oriented to person, place, and time.     Cranial Nerves: No cranial nerve deficit.  Psychiatric:        Behavior: Behavior normal.        Thought Content: Thought content normal.  Judgment: Judgment normal.       Assessment/Plan: 1. Essential hypertension (Primary) Will add low dose amlodipine  and titrate as needed. Advised to monitor closely - amLODipine  (NORVASC ) 2.5 MG tablet; Take 1 tablet (2.5 mg total) by mouth daily.  Dispense: 30 tablet; Refill: 2 - hydrochlorothiazide  (HYDRODIURIL ) 25 MG tablet; Take 1 tablet (25 mg total) by mouth daily.  Dispense: 90 tablet; Refill: 1  2. Vasomotor symptoms due to menopause Will check labs. Discussed options to help with night sweats that are alternatives to HRT.  - Estradiol  - FSH/LH - Prolactin - Estrogens , Total  3. Vitamin D  deficiency - VITAMIN D  25 Hydroxy (Vit-D Deficiency, Fractures)  4. Thyroid disorder screen - TSH + free T4  5. Hyperlipidemia LDL goal <100 - Lipid Panel With LDL/HDL Ratio  6. Diabetes mellitus screening - Hgb A1C w/o eAG  7. B12 deficiency - B12 and Folate Panel  8. Other fatigue - CBC w/Diff/Platelet - Comprehensive metabolic panel - TSH + free T4 - Lipid Panel With LDL/HDL Ratio - Hgb A1C w/o eAG - VITAMIN D  25 Hydroxy (Vit-D Deficiency, Fractures) - B12 and Folate Panel  9. Encounter to establish care Reviewed hx and will order labs   General Counseling: naketa daddario understanding of the findings of todays visit and agrees with plan of treatment. I have discussed any further diagnostic evaluation that may be needed or ordered today. We also reviewed her medications today. she has been encouraged to call the  office with any questions or concerns that should arise related to todays visit.    Counseling:    Orders Placed This Encounter  Procedures   CBC w/Diff/Platelet   Comprehensive metabolic panel   TSH + free T4   Lipid Panel With LDL/HDL Ratio   Hgb A1C w/o eAG   VITAMIN D  25 Hydroxy (Vit-D Deficiency, Fractures)   B12 and Folate Panel   Estradiol    FSH/LH   Prolactin   Estrogens , Total    Meds ordered this encounter  Medications   amLODipine  (NORVASC ) 2.5 MG tablet    Sig: Take 1 tablet (2.5 mg total) by mouth daily.    Dispense:  30 tablet    Refill:  2   hydrochlorothiazide  (HYDRODIURIL ) 25 MG tablet    Sig: Take 1 tablet (25 mg total) by mouth daily.    Dispense:  90 tablet    Refill:  1     This patient was seen by Tinnie Pro, PA-C in collaboration with Dr. Sigrid Bathe as a part of collaborative care agreement.   Time spent:35 Minutes

## 2023-04-05 LAB — CBC WITH DIFFERENTIAL/PLATELET
Basophils Absolute: 0.1 10*3/uL (ref 0.0–0.2)
Basos: 1 %
EOS (ABSOLUTE): 0.2 10*3/uL (ref 0.0–0.4)
Eos: 2 %
Hematocrit: 44.9 % (ref 34.0–46.6)
Hemoglobin: 14.4 g/dL (ref 11.1–15.9)
Immature Grans (Abs): 0 10*3/uL (ref 0.0–0.1)
Immature Granulocytes: 0 %
Lymphocytes Absolute: 3.5 10*3/uL — ABNORMAL HIGH (ref 0.7–3.1)
Lymphs: 43 %
MCH: 30.3 pg (ref 26.6–33.0)
MCHC: 32.1 g/dL (ref 31.5–35.7)
MCV: 95 fL (ref 79–97)
Monocytes Absolute: 0.4 10*3/uL (ref 0.1–0.9)
Monocytes: 5 %
Neutrophils Absolute: 4 10*3/uL (ref 1.4–7.0)
Neutrophils: 49 %
Platelets: 253 10*3/uL (ref 150–450)
RBC: 4.75 x10E6/uL (ref 3.77–5.28)
RDW: 13.2 % (ref 11.7–15.4)
WBC: 8.1 10*3/uL (ref 3.4–10.8)

## 2023-04-05 LAB — COMPREHENSIVE METABOLIC PANEL
ALT: 15 [IU]/L (ref 0–32)
AST: 20 [IU]/L (ref 0–40)
Albumin: 4.1 g/dL (ref 3.8–4.9)
Alkaline Phosphatase: 112 [IU]/L (ref 44–121)
BUN/Creatinine Ratio: 11 (ref 9–23)
BUN: 12 mg/dL (ref 6–24)
Bilirubin Total: 0.2 mg/dL (ref 0.0–1.2)
CO2: 26 mmol/L (ref 20–29)
Calcium: 9.6 mg/dL (ref 8.7–10.2)
Chloride: 104 mmol/L (ref 96–106)
Creatinine, Ser: 1.08 mg/dL — ABNORMAL HIGH (ref 0.57–1.00)
Globulin, Total: 2.3 g/dL (ref 1.5–4.5)
Glucose: 104 mg/dL — ABNORMAL HIGH (ref 70–99)
Potassium: 4.2 mmol/L (ref 3.5–5.2)
Sodium: 146 mmol/L — ABNORMAL HIGH (ref 134–144)
Total Protein: 6.4 g/dL (ref 6.0–8.5)
eGFR: 60 mL/min/{1.73_m2} (ref >59)

## 2023-04-05 LAB — B12 AND FOLATE PANEL
Folate: 6.5 ng/mL (ref >3.0)
Vitamin B-12: 445 pg/mL (ref 232–1245)

## 2023-04-05 LAB — LIPID PANEL WITH LDL/HDL RATIO
Cholesterol, Total: 249 mg/dL — ABNORMAL HIGH (ref 100–199)
HDL: 42 mg/dL (ref >39)
LDL Chol Calc (NIH): 181 mg/dL — ABNORMAL HIGH (ref 0–99)
LDL/HDL Ratio: 4.3 {ratio} — ABNORMAL HIGH (ref 0.0–3.2)
Triglycerides: 143 mg/dL (ref 0–149)
VLDL Cholesterol Cal: 26 mg/dL (ref 5–40)

## 2023-04-05 LAB — FSH/LH
FSH: 87.5 m[IU]/mL
LH: 51.8 m[IU]/mL

## 2023-04-05 LAB — ESTRADIOL: Estradiol: 13 pg/mL

## 2023-04-05 LAB — HGB A1C W/O EAG: Hgb A1c MFr Bld: 5.7 % — ABNORMAL HIGH (ref 4.8–5.6)

## 2023-04-05 LAB — TSH+FREE T4
Free T4: 1.1 ng/dL (ref 0.82–1.77)
TSH: 2 u[IU]/mL (ref 0.450–4.500)

## 2023-04-05 LAB — PROLACTIN: Prolactin: 14.3 ng/mL (ref 3.6–25.2)

## 2023-04-05 LAB — VITAMIN D 25 HYDROXY (VIT D DEFICIENCY, FRACTURES): Vit D, 25-Hydroxy: 23.7 ng/mL — ABNORMAL LOW (ref 30.0–100.0)

## 2023-04-05 LAB — ESTROGENS, TOTAL: Estrogen: 74 pg/mL (ref 40–244)

## 2023-04-17 ENCOUNTER — Ambulatory Visit: Payer: 59 | Admitting: Physician Assistant

## 2023-04-27 ENCOUNTER — Ambulatory Visit: Payer: 59 | Admitting: Physician Assistant

## 2023-04-27 ENCOUNTER — Encounter: Payer: Self-pay | Admitting: Physician Assistant

## 2023-04-27 VITALS — BP 130/95 | HR 77 | Temp 98.5°F | Resp 16 | Ht 63.0 in | Wt 201.8 lb

## 2023-04-27 DIAGNOSIS — E669 Obesity, unspecified: Secondary | ICD-10-CM

## 2023-04-27 DIAGNOSIS — Z1231 Encounter for screening mammogram for malignant neoplasm of breast: Secondary | ICD-10-CM

## 2023-04-27 DIAGNOSIS — E785 Hyperlipidemia, unspecified: Secondary | ICD-10-CM | POA: Diagnosis not present

## 2023-04-27 DIAGNOSIS — R7989 Other specified abnormal findings of blood chemistry: Secondary | ICD-10-CM

## 2023-04-27 DIAGNOSIS — I10 Essential (primary) hypertension: Secondary | ICD-10-CM | POA: Diagnosis not present

## 2023-04-27 DIAGNOSIS — D72820 Lymphocytosis (symptomatic): Secondary | ICD-10-CM | POA: Diagnosis not present

## 2023-04-27 DIAGNOSIS — R7303 Prediabetes: Secondary | ICD-10-CM

## 2023-04-27 DIAGNOSIS — E559 Vitamin D deficiency, unspecified: Secondary | ICD-10-CM

## 2023-04-27 MED ORDER — ROSUVASTATIN CALCIUM 5 MG PO TABS: 5.0000 mg | ORAL_TABLET | Freq: Every day | ORAL | 3 refills | Status: AC

## 2023-04-27 NOTE — Progress Notes (Signed)
[facility] [address] [place], [place] [address]  Internal MEDICINE  Office Visit Note  Patient Name: Taylor Morris  [id number]  [id number]  Date of Service: 04/27/2023  Chief Complaint  Patient presents with   Follow-up   Hypertension   Quality Metric Gaps    Mammogram and Colonoscopy    HPI Pt is here for routine follow up to review labs -Taking amlodipine and hydrochlorothiazide in afternoon and BP has been improving, though states still 130s/90s typically which is similar to office reading. Will double amlodipine to 5mg  -Labs reviewed--creatinine a little elevated, will recheck this. Lymphocytes also slightly increased and will recheck -Elevated cholesterol--has been making diet changes and has improved from last year but still high, open to crestor 2 days per week and increasing as able. She often forgets meds so 2x weekly dosing may work well. -A1c--5.7 which is prediabetic,  she is interested in trying GLP1, she will call insurance to see if covered wt loss meds and will let office know about wegovy vs zepbound  -no Fhx of personal hx of thyroid cancer -Vit D low, will restart OTC supplement -labs consistent with postmenopausal -due for mammogram -recent sinus congestion and coughing some stuff up but this has improved some. Thinks may be getting some stuff up since quitting smoking as well  Current Medication: Outpatient Encounter Medications as of 04/27/2023  Medication Sig   amLODipine (NORVASC) 2.5 MG tablet Take 1 tablet (2.5 mg total) by mouth daily.   hydrochlorothiazide (HYDRODIURIL) 25 MG tablet Take 1 tablet (25 mg total) by mouth daily.   rosuvastatin (CRESTOR) 5 MG tablet Take 1 tablet (5 mg total) by mouth daily.   No facility-administered encounter medications on file as of 04/27/2023.    Surgical History: Past Surgical History:  Procedure Laterality Date   ABDOMINAL HYSTERECTOMY     BREAST BIOPSY Right 07/2014   surgery to remove abcess  3:00/ Dr Egbert Garibaldi   BREAST DUCTAL SYSTEM EXCISION Right 07/22/2014   Procedure: right breast major duct excision ;  Surgeon: Natale Lay, MD;  Location: [facility] ORS;  Service: General;  Laterality: Right;   BREAST EXCISIONAL BIOPSY     CESAREAN SECTION     times 2   CHOLECYSTECTOMY     COLONOSCOPY WITH PROPOFOL N/A 05/02/2022   Procedure: COLONOSCOPY WITH PROPOFOL;  Surgeon: Toney Reil, MD;  Location: [facility] ENDOSCOPY;  Service: Gastroenterology;  Laterality: N/A;   INCISION AND DRAINAGE BREAST ABSCESS Right 05/16   TUBAL LIGATION      Medical History: Past Medical History:  Diagnosis Date   Anxiety    Arthritis    Complication of anesthesia    H/O cesarean section    times 2   PONV (postoperative nausea and vomiting)     Family History: Family History  Problem Relation Age of Onset   Hypertension Mother    High Cholesterol Mother    Hypertension Father    High Cholesterol Father    Breast cancer Maternal Aunt 43   Cancer Maternal Aunt     Social History   Socioeconomic History   Marital status: Widowed    Spouse name: Not on file   Number of children: Not on file   Years of education: Not on file   Highest education level: Not on file  Occupational History   Not on file  Tobacco Use   Smoking status: Some Days    Current packs/day: 0.25    Average packs/day: 0.3 packs/day for 30.0  years (7.5 ttl pk-yrs)    Types: Cigarettes   Smokeless tobacco: Never  Vaping Use   Vaping status: Every Day  Substance and Sexual Activity   Alcohol use: Yes    Comment: ocassionally   Drug use: Yes    Types: Marijuana   Sexual activity: Not Currently  Other Topics Concern   Not on file  Social History Narrative   Not on file   Social Drivers of Health   Financial Resource Strain: Not on file  Food Insecurity: Not on file  Transportation Needs: Not on file  Physical Activity: Not on file  Stress: Not on file  Social Connections: Not on file  Intimate Partner Violence:  Not on file      Review of Systems  Constitutional:  Negative for chills, fatigue and unexpected weight change.  HENT:  Positive for congestion and postnasal drip. Negative for rhinorrhea, sneezing and sore throat.   Eyes:  Negative for redness.  Respiratory:  Positive for cough. Negative for chest tightness and shortness of breath.   Cardiovascular:  Negative for chest pain and palpitations.  Gastrointestinal:  Negative for abdominal pain, constipation, diarrhea, nausea and vomiting.  Endocrine:       Night sweats  Genitourinary:  Negative for dysuria and frequency.  Musculoskeletal:  Negative for arthralgias, back pain, joint swelling and neck pain.  Skin:  Negative for rash.  Neurological: Negative.  Negative for tremors and numbness.  Hematological:  Negative for adenopathy. Does not bruise/bleed easily.  Psychiatric/Behavioral:  Negative for behavioral problems (Depression) and suicidal ideas. The patient is not nervous/anxious.     Vital Signs: BP (!) 130/95   Pulse 77   Temp 98.5 F (36.9 C)   Resp 16   Ht 5\' 3"  (1.6 m)   Wt 201 lb 12.8 oz (91.5 kg)   SpO2 99%   BMI 35.75 kg/m    Physical Exam Vitals and nursing note reviewed.  Constitutional:      General: She is not in acute distress.    Appearance: Normal appearance. She is well-developed. She is obese. She is not diaphoretic.  HENT:     Head: Normocephalic and atraumatic.     Mouth/Throat:     Pharynx: No oropharyngeal exudate.  Eyes:     Pupils: Pupils are equal, round, and reactive to light.  Neck:     Thyroid: No thyromegaly.     Vascular: No JVD.     Trachea: No tracheal deviation.  Cardiovascular:     Rate and Rhythm: Normal rate and regular rhythm.     Heart sounds: Normal heart sounds. No murmur heard.    No friction rub. No gallop.  Pulmonary:     Effort: Pulmonary effort is normal.  Chest:     Chest wall: No tenderness.  Abdominal:     General: Bowel sounds are normal.     Palpations:  Abdomen is soft.  Musculoskeletal:        General: Normal range of motion.     Cervical back: Normal range of motion and neck supple.  Lymphadenopathy:     Cervical: No cervical adenopathy.  Skin:    General: Skin is warm and dry.  Neurological:     Mental Status: She is alert and oriented to person, place, and time.     Cranial Nerves: No cranial nerve deficit.  Psychiatric:        Behavior: Behavior normal.        Thought Content: Thought content normal.  Judgment: Judgment normal.        Assessment/Plan: 1. Essential hypertension (Primary) Improving, but still elevated. Will double amlodipine to 5mg  daily and will call when new script needed for higher dose. Monitor closely and follow up in 4 weeks  2. Hyperlipidemia LDL goal <100 Will add crestor at least 2x per week and work on diet and exercise - rosuvastatin (CRESTOR) 5 MG tablet; Take 1 tablet (5 mg total) by mouth daily.  Dispense: 30 tablet; Refill: 3  3. Elevated lymphocytes Will recheck labs - CBC w/Diff/Platelet  4. Elevated serum creatinine Will recheck labs - Basic Metabolic Panel (BMET)  5. Visit for screening mammogram - MM 3D SCREENING MAMMOGRAM BILATERAL BREAST; Future  6. Vitamin D deficiency Restart vit D supplement  7. Prediabetes Will work on diet and exercise, may see about GLP1 for wt loss which would also help sugar  8. Obesity (BMI 30-39.9) Pt will call insurance about GLP1 coverage for wt loss and let office know    General Counseling: Paizleigh verbalizes understanding of the findings of todays visit and agrees with plan of treatment. I have discussed any further diagnostic evaluation that may be needed or ordered today. We also reviewed her medications today. she has been encouraged to call the office with any questions or concerns that should arise related to todays visit.    Orders Placed This Encounter  Procedures   MM 3D SCREENING MAMMOGRAM BILATERAL BREAST   CBC  w/Diff/Platelet   Basic Metabolic Panel (BMET)    Meds ordered this encounter  Medications   rosuvastatin (CRESTOR) 5 MG tablet    Sig: Take 1 tablet (5 mg total) by mouth daily.    Dispense:  30 tablet    Refill:  3    This patient was seen by Lynn Ito, PA-C in collaboration with Dr. Beverely Risen as a part of collaborative care agreement.   Total time spent:30 Minutes Time spent includes review of chart, medications, test results, and follow up plan with the patient.      Dr Lyndon Code Internal medicine

## 2023-05-17 LAB — CBC WITH DIFFERENTIAL/PLATELET
Basophils Absolute: 0.1 10*3/uL (ref 0.0–0.2)
Basos: 1 %
EOS (ABSOLUTE): 0.2 10*3/uL (ref 0.0–0.4)
Eos: 2 %
Hematocrit: 44.6 % (ref 34.0–46.6)
Hemoglobin: 14.7 g/dL (ref 11.1–15.9)
Immature Grans (Abs): 0 10*3/uL (ref 0.0–0.1)
Immature Granulocytes: 0 %
Lymphocytes Absolute: 3 10*3/uL (ref 0.7–3.1)
Lymphs: 32 %
MCH: 30.5 pg (ref 26.6–33.0)
MCHC: 33 g/dL (ref 31.5–35.7)
MCV: 93 fL (ref 79–97)
Monocytes Absolute: 0.5 10*3/uL (ref 0.1–0.9)
Monocytes: 5 %
Neutrophils Absolute: 5.6 10*3/uL (ref 1.4–7.0)
Neutrophils: 60 %
Platelets: 256 10*3/uL (ref 150–450)
RBC: 4.82 x10E6/uL (ref 3.77–5.28)
RDW: 13.1 % (ref 11.7–15.4)
WBC: 9.4 10*3/uL (ref 3.4–10.8)

## 2023-05-17 LAB — BASIC METABOLIC PANEL
BUN/Creatinine Ratio: 12 (ref 9–23)
BUN: 11 mg/dL (ref 6–24)
CO2: 25 mmol/L (ref 20–29)
Calcium: 9.6 mg/dL (ref 8.7–10.2)
Chloride: 99 mmol/L (ref 96–106)
Creatinine, Ser: 0.92 mg/dL (ref 0.57–1.00)
Glucose: 97 mg/dL (ref 70–99)
Potassium: 3.6 mmol/L (ref 3.5–5.2)
Sodium: 139 mmol/L (ref 134–144)
eGFR: 73 mL/min/{1.73_m2} (ref >59)

## 2023-05-25 ENCOUNTER — Ambulatory Visit (INDEPENDENT_AMBULATORY_CARE_PROVIDER_SITE_OTHER): Payer: 59 | Admitting: Physician Assistant

## 2023-05-25 ENCOUNTER — Encounter: Payer: Self-pay | Admitting: Physician Assistant

## 2023-05-25 VITALS — BP 140/80 | HR 71 | Temp 97.8°F | Resp 16 | Ht 63.0 in | Wt 201.0 lb

## 2023-05-25 DIAGNOSIS — I10 Essential (primary) hypertension: Secondary | ICD-10-CM

## 2023-05-25 DIAGNOSIS — E669 Obesity, unspecified: Secondary | ICD-10-CM

## 2023-05-25 DIAGNOSIS — E785 Hyperlipidemia, unspecified: Secondary | ICD-10-CM | POA: Diagnosis not present

## 2023-05-25 DIAGNOSIS — R4586 Emotional lability: Secondary | ICD-10-CM

## 2023-05-25 MED ORDER — BUPROPION HCL ER (XL) 150 MG PO TB24
150.0000 mg | ORAL_TABLET | Freq: Every day | ORAL | 2 refills | Status: DC
Start: 2023-05-25 — End: 2023-08-21

## 2023-05-25 NOTE — Progress Notes (Signed)
 [facility] [address] [place], [place] [address]  Internal MEDICINE  Office Visit Note  Patient Name: Taylor Morris  [id number]  [id number]  Date of Service: 06/03/2023  Chief Complaint  Patient presents with   Follow-up   Hypertension    HPI Pt is here for routine follow up -BP at home 140-150 systolic before meds, after meds its 120-130/80 -Normally takes meds at night, but did take hydrochlorothiazide this morning. -repeat labs looked good -does skip amlodipine some days and will do better about this, only doing the 2.5mg  -taking crestor 2x per week, a little cramps but tolerable -insurance would not approve Glp1 unless cardiac indication possibly  -Snoring at night, lays down then will fall asleep immediately. Sometimes refreshing. May need to consider sleep study in future -trying to start walking on treadmill at work and taking steps at work -would like to try wellbutrin to help with weight and mood swings   Current Medication: Outpatient Encounter Medications as of 05/25/2023  Medication Sig   amLODipine (NORVASC) 2.5 MG tablet Take 1 tablet (2.5 mg total) by mouth daily.   buPROPion (WELLBUTRIN XL) 150 MG 24 hr tablet Take 1 tablet (150 mg total) by mouth daily.   hydrochlorothiazide (HYDRODIURIL) 25 MG tablet Take 1 tablet (25 mg total) by mouth daily.   rosuvastatin (CRESTOR) 5 MG tablet Take 1 tablet (5 mg total) by mouth daily.   No facility-administered encounter medications on file as of 05/25/2023.    Surgical History: Past Surgical History:  Procedure Laterality Date   ABDOMINAL HYSTERECTOMY     BREAST BIOPSY Right 07/2014   surgery to remove abcess 3:00/ Dr Egbert Garibaldi   BREAST DUCTAL SYSTEM EXCISION Right 07/22/2014   Procedure: right breast major duct excision ;  Surgeon: Natale Lay, MD;  Location: [facility] ORS;  Service: General;  Laterality: Right;   BREAST EXCISIONAL BIOPSY     CESAREAN SECTION     times 2   CHOLECYSTECTOMY     COLONOSCOPY  WITH PROPOFOL N/A 05/02/2022   Procedure: COLONOSCOPY WITH PROPOFOL;  Surgeon: Toney Reil, MD;  Location: [facility] ENDOSCOPY;  Service: Gastroenterology;  Laterality: N/A;   INCISION AND DRAINAGE BREAST ABSCESS Right 05/16   TUBAL LIGATION      Medical History: Past Medical History:  Diagnosis Date   Anxiety    Arthritis    Complication of anesthesia    H/O cesarean section    times 2   Hypertension    PONV (postoperative nausea and vomiting)     Family History: Family History  Problem Relation Age of Onset   Hypertension Mother    High Cholesterol Mother    Hypertension Father    High Cholesterol Father    Breast cancer Maternal Aunt 67   Cancer Maternal Aunt     Social History   Socioeconomic History   Marital status: Widowed    Spouse name: Not on file   Number of children: Not on file   Years of education: Not on file   Highest education level: Not on file  Occupational History   Not on file  Tobacco Use   Smoking status: Every Day    Types: E-cigarettes   Smokeless tobacco: Never   Tobacco comments:    Pt quit smoking 09/2022 used smoke 1 pack a day  Vaping Use   Vaping status: Every Day  Substance and Sexual Activity   Alcohol use: Yes    Comment: ocassionally   Drug use: Yes  Types: Marijuana   Sexual activity: Not Currently  Other Topics Concern   Not on file  Social History Narrative   Not on file   Social Drivers of Health   Financial Resource Strain: Not on file  Food Insecurity: Not on file  Transportation Needs: Not on file  Physical Activity: Not on file  Stress: Not on file  Social Connections: Not on file  Intimate Partner Violence: Not on file      Review of Systems  Constitutional:  Negative for chills, fatigue and unexpected weight change.  HENT:  Negative for congestion, rhinorrhea, sneezing and sore throat.   Eyes:  Negative for redness.  Respiratory:  Negative for chest tightness and shortness of breath.    Cardiovascular:  Negative for chest pain and palpitations.  Gastrointestinal:  Negative for abdominal pain, constipation, diarrhea, nausea and vomiting.  Endocrine:       Night sweats  Genitourinary:  Negative for dysuria and frequency.  Musculoskeletal:  Negative for arthralgias, back pain, joint swelling and neck pain.  Skin:  Negative for rash.  Neurological: Negative.  Negative for tremors and numbness.  Hematological:  Negative for adenopathy. Does not bruise/bleed easily.  Psychiatric/Behavioral:  Negative for behavioral problems (Depression) and suicidal ideas. The patient is not nervous/anxious.     Vital Signs: BP (!) 140/80   Pulse 71   Temp 97.8 F (36.6 C)   Resp 16   Ht 5\' 3"  (1.6 m)   Wt 201 lb (91.2 kg)   SpO2 98%   BMI 35.61 kg/m    Physical Exam Vitals and nursing note reviewed.  Constitutional:      General: She is not in acute distress.    Appearance: Normal appearance. She is well-developed. She is obese. She is not diaphoretic.  HENT:     Head: Normocephalic and atraumatic.  Neck:     Thyroid: No thyromegaly.     Vascular: No JVD.     Trachea: No tracheal deviation.  Cardiovascular:     Rate and Rhythm: Normal rate and regular rhythm.     Heart sounds: Normal heart sounds. No murmur heard.    No friction rub. No gallop.  Pulmonary:     Effort: Pulmonary effort is normal.  Chest:     Chest wall: No tenderness.  Musculoskeletal:        General: Normal range of motion.     Cervical back: Normal range of motion and neck supple.  Lymphadenopathy:     Cervical: No cervical adenopathy.  Skin:    General: Skin is warm and dry.  Neurological:     Mental Status: She is alert and oriented to person, place, and time.  Psychiatric:        Behavior: Behavior normal.        Thought Content: Thought content normal.        Judgment: Judgment normal.        Assessment/Plan: 1. Essential hypertension (Primary) Borderline in office, admits to not  taking amlodipine every day and will do better about this. May need to increase dose in future pending response when taking daily  2. Mood swings Will start wellbutrin, advised on possible S/E - buPROPion (WELLBUTRIN XL) 150 MG 24 hr tablet; Take 1 tablet (150 mg total) by mouth daily.  Dispense: 30 tablet; Refill: 2  3. Hyperlipidemia LDL goal <100 Tolerating crestor and will continue  4. Obesity (BMI 30-39.9) Will work on diet and exercise and will start on wellbutrin  General Counseling: laportia carley understanding of the findings of todays visit and agrees with plan of treatment. I have discussed any further diagnostic evaluation that may be needed or ordered today. We also reviewed her medications today. she has been encouraged to call the office with any questions or concerns that should arise related to todays visit.    No orders of the defined types were placed in this encounter.   Meds ordered this encounter  Medications   buPROPion (WELLBUTRIN XL) 150 MG 24 hr tablet    Sig: Take 1 tablet (150 mg total) by mouth daily.    Dispense:  30 tablet    Refill:  2    This patient was seen by Lynn Ito, PA-C in collaboration with Dr. Beverely Risen as a part of collaborative care agreement.   Total time spent:30 Minutes Time spent includes review of chart, medications, test results, and follow up plan with the patient.      Dr Lyndon Code Internal medicine

## 2023-05-30 ENCOUNTER — Telehealth: Payer: Self-pay | Admitting: Physician Assistant

## 2023-05-30 NOTE — Telephone Encounter (Signed)
 Lvm notifying patient of mammogram appointment date, arrival time, location-[place]

## 2023-06-15 ENCOUNTER — Ambulatory Visit
Admission: RE | Admit: 2023-06-15 | Discharge: 2023-06-15 | Disposition: A | Discharge status: Home / Self Care | Source: Ambulatory Visit | Attending: Physician Assistant | Admitting: Physician Assistant

## 2023-06-15 DIAGNOSIS — Z1231 Encounter for screening mammogram for malignant neoplasm of breast: Secondary | ICD-10-CM | POA: Diagnosis present

## 2023-07-03 ENCOUNTER — Ambulatory Visit: Admitting: Physician Assistant

## 2023-07-24 ENCOUNTER — Encounter: Payer: Self-pay | Admitting: Physician Assistant

## 2023-07-24 ENCOUNTER — Ambulatory Visit (INDEPENDENT_AMBULATORY_CARE_PROVIDER_SITE_OTHER): Admitting: Physician Assistant

## 2023-07-24 VITALS — BP 130/82 | HR 71 | Temp 97.8°F | Resp 16 | Ht 62.0 in | Wt 197.0 lb

## 2023-07-24 DIAGNOSIS — E785 Hyperlipidemia, unspecified: Secondary | ICD-10-CM | POA: Diagnosis not present

## 2023-07-24 DIAGNOSIS — I10 Essential (primary) hypertension: Secondary | ICD-10-CM | POA: Diagnosis not present

## 2023-07-24 DIAGNOSIS — R7303 Prediabetes: Secondary | ICD-10-CM

## 2023-07-24 DIAGNOSIS — R4586 Emotional lability: Secondary | ICD-10-CM

## 2023-07-24 DIAGNOSIS — E669 Obesity, unspecified: Secondary | ICD-10-CM

## 2023-07-24 MED ORDER — SEMAGLUTIDE-WEIGHT MANAGEMENT 0.25 MG/0.5ML ~~LOC~~ SOAJ: 0.2500 mg | SUBCUTANEOUS | 2 refills | Status: DC

## 2023-07-24 NOTE — Progress Notes (Signed)
 [facility] [address] [place], [place] [address]  Internal MEDICINE  Office Visit Note  Patient Name: Taylor Morris  [id number]  [id number]  Date of Service: 07/24/2023  Chief Complaint  Patient presents with   Follow-up   Hypertension    HPI Pt is here for routine follow up -Pt states insurance said PA could be done for wegovy coverage and she would like to move forward with this -Down 4 lbs since last visit, states she did a lot of walking on a trip -doing well with wellbutrin  and will continue -taking hydrochlorothiazide  in AM, 2.5mg  amlodipine  in evening regularly now and BP improved -still vaping, but no cigs and working on stopping -mammogram done  Current Medication: Outpatient Encounter Medications as of 07/24/2023  Medication Sig   amLODipine  (NORVASC ) 2.5 MG tablet Take 1 tablet (2.5 mg total) by mouth daily.   buPROPion  (WELLBUTRIN  XL) 150 MG 24 hr tablet Take 1 tablet (150 mg total) by mouth daily.   hydrochlorothiazide  (HYDRODIURIL ) 25 MG tablet Take 1 tablet (25 mg total) by mouth daily.   rosuvastatin  (CRESTOR ) 5 MG tablet Take 1 tablet (5 mg total) by mouth daily.   Semaglutide-Weight Management 0.25 MG/0.5ML SOAJ Inject 0.25 mg into the skin once a week.   No facility-administered encounter medications on file as of 07/24/2023.    Surgical History: Past Surgical History:  Procedure Laterality Date   ABDOMINAL HYSTERECTOMY     BREAST BIOPSY Right 07/2014   surgery to remove abcess 3:00/ Dr Adele Admire   BREAST DUCTAL SYSTEM EXCISION Right 07/22/2014   Procedure: right breast major duct excision ;  Surgeon: Lauretta Ponto, MD;  Location: [facility] ORS;  Service: General;  Laterality: Right;   BREAST EXCISIONAL BIOPSY     CESAREAN SECTION     times 2   CHOLECYSTECTOMY     COLONOSCOPY WITH PROPOFOL  N/A 05/02/2022   Procedure: COLONOSCOPY WITH PROPOFOL ;  Surgeon: Selena Daily, MD;  Location: [facility] ENDOSCOPY;  Service: Gastroenterology;  Laterality: N/A;    INCISION AND DRAINAGE BREAST ABSCESS Right 05/16   TUBAL LIGATION      Medical History: Past Medical History:  Diagnosis Date   Anxiety    Arthritis    Complication of anesthesia    H/O cesarean section    times 2   Hypertension    PONV (postoperative nausea and vomiting)     Family History: Family History  Problem Relation Age of Onset   Hypertension Mother    High Cholesterol Mother    Hypertension Father    High Cholesterol Father    Breast cancer Maternal Aunt 16   Cancer Maternal Aunt     Social History   Socioeconomic History   Marital status: Widowed    Spouse name: Not on file   Number of children: Not on file   Years of education: Not on file   Highest education level: Not on file  Occupational History   Not on file  Tobacco Use   Smoking status: Every Day    Types: E-cigarettes   Smokeless tobacco: Never   Tobacco comments:    Pt quit smoking 09/2022 used smoke 1 pack a day  Vaping Use   Vaping status: Every Day  Substance and Sexual Activity   Alcohol use: Yes    Comment: ocassionally   Drug use: Yes    Types: Marijuana   Sexual activity: Not Currently  Other Topics Concern   Not on file  Social History Narrative  Not on file   Social Drivers of Health   Financial Resource Strain: Not on file  Food Insecurity: Not on file  Transportation Needs: Not on file  Physical Activity: Not on file  Stress: Not on file  Social Connections: Not on file  Intimate Partner Violence: Not on file      Review of Systems  Constitutional:  Negative for chills, fatigue and unexpected weight change.  HENT:  Negative for congestion, rhinorrhea, sneezing and sore throat.   Eyes:  Negative for redness.  Respiratory:  Negative for chest tightness and shortness of breath.   Cardiovascular:  Negative for chest pain and palpitations.  Gastrointestinal:  Negative for abdominal pain, constipation, diarrhea, nausea and vomiting.  Genitourinary:  Negative for  dysuria and frequency.  Musculoskeletal:  Negative for arthralgias, back pain, joint swelling and neck pain.  Skin:  Negative for rash.  Neurological: Negative.  Negative for tremors and numbness.  Hematological:  Negative for adenopathy. Does not bruise/bleed easily.  Psychiatric/Behavioral:  Negative for behavioral problems (Depression) and suicidal ideas. The patient is not nervous/anxious.     Vital Signs: BP 130/82   Pulse 71   Temp 97.8 F (36.6 C)   Resp 16   Ht 5\' 2"  (1.575 m)   Wt 197 lb (89.4 kg)   SpO2 98%   BMI 36.03 kg/m    Physical Exam Vitals and nursing note reviewed.  Constitutional:      General: She is not in acute distress.    Appearance: Normal appearance. She is well-developed. She is obese. She is not diaphoretic.  HENT:     Head: Normocephalic and atraumatic.  Neck:     Thyroid: No thyromegaly.     Vascular: No JVD.     Trachea: No tracheal deviation.  Cardiovascular:     Rate and Rhythm: Normal rate and regular rhythm.     Heart sounds: Normal heart sounds. No murmur heard.    No friction rub. No gallop.  Pulmonary:     Effort: Pulmonary effort is normal.  Chest:     Chest wall: No tenderness.  Musculoskeletal:        General: Normal range of motion.     Cervical back: Normal range of motion and neck supple.  Lymphadenopathy:     Cervical: No cervical adenopathy.  Skin:    General: Skin is warm and dry.  Neurological:     Mental Status: She is alert and oriented to person, place, and time.  Psychiatric:        Behavior: Behavior normal.        Thought Content: Thought content normal.        Judgment: Judgment normal.        Assessment/Plan: 1. Essential hypertension (Primary) BP stable, continue current medications - Semaglutide-Weight Management 0.25 MG/0.5ML SOAJ; Inject 0.25 mg into the skin once a week.  Dispense: 2 mL; Refill: 2  2. Mood swings Continue wellbutrin   3. Hyperlipidemia LDL goal <100 Continue crestor  as  before - Semaglutide-Weight Management 0.25 MG/0.5ML SOAJ; Inject 0.25 mg into the skin once a week.  Dispense: 2 mL; Refill: 2  4. Prediabetes Will send weogvy for wt loss, but would also help with prediabetes - Semaglutide-Weight Management 0.25 MG/0.5ML SOAJ; Inject 0.25 mg into the skin once a week.  Dispense: 2 mL; Refill: 2  5. Obesity (BMI 30-39.9) Will send wevogy and titrate as needed. States she was told a PA is required and that if possible send labs with  it for further justification if asked - Semaglutide-Weight Management 0.25 MG/0.5ML SOAJ; Inject 0.25 mg into the skin once a week.  Dispense: 2 mL; Refill: 2   General Counseling: Joline verbalizes understanding of the findings of todays visit and agrees with plan of treatment. I have discussed any further diagnostic evaluation that may be needed or ordered today. We also reviewed her medications today. she has been encouraged to call the office with any questions or concerns that should arise related to todays visit.    No orders of the defined types were placed in this encounter.   Meds ordered this encounter  Medications   Semaglutide-Weight Management 0.25 MG/0.5ML SOAJ    Sig: Inject 0.25 mg into the skin once a week.    Dispense:  2 mL    Refill:  2    This patient was seen by Taylor Favia, PA-C in collaboration with Dr. Verneta Gone as a part of collaborative care agreement.   Total time spent:30 Minutes Time spent includes review of chart, medications, test results, and follow up plan with the patient.      Dr Fozia M Khan Internal medicine

## 2023-08-08 ENCOUNTER — Telehealth: Payer: Self-pay | Admitting: Physician Assistant

## 2023-08-09 ENCOUNTER — Telehealth: Payer: Self-pay

## 2023-08-09 NOTE — Telephone Encounter (Signed)
 Error

## 2023-08-11 NOTE — Telephone Encounter (Signed)
 Faxed another appeal for Wegovy .

## 2023-08-21 ENCOUNTER — Other Ambulatory Visit: Payer: Self-pay

## 2023-08-21 DIAGNOSIS — R4586 Emotional lability: Secondary | ICD-10-CM

## 2023-08-21 MED ORDER — BUPROPION HCL ER (XL) 150 MG PO TB24: 150.0000 mg | ORAL_TABLET | Freq: Every day | ORAL | 2 refills | Status: DC

## 2023-08-22 ENCOUNTER — Telehealth: Payer: Self-pay

## 2023-08-22 NOTE — Telephone Encounter (Signed)
 Notified patient about Wegovy  denial via MyChart.

## 2023-09-04 ENCOUNTER — Ambulatory Visit (INDEPENDENT_AMBULATORY_CARE_PROVIDER_SITE_OTHER): Admitting: Physician Assistant

## 2023-09-04 ENCOUNTER — Encounter: Payer: Self-pay | Admitting: Physician Assistant

## 2023-09-04 VITALS — BP 121/85 | HR 77 | Temp 98.3°F | Resp 16 | Ht 62.0 in | Wt 197.0 lb

## 2023-09-04 DIAGNOSIS — Z0001 Encounter for general adult medical examination with abnormal findings: Secondary | ICD-10-CM

## 2023-09-04 DIAGNOSIS — Z87891 Personal history of nicotine dependence: Secondary | ICD-10-CM

## 2023-09-04 DIAGNOSIS — R194 Change in bowel habit: Secondary | ICD-10-CM | POA: Diagnosis not present

## 2023-09-04 DIAGNOSIS — E669 Obesity, unspecified: Secondary | ICD-10-CM

## 2023-09-04 DIAGNOSIS — Z1212 Encounter for screening for malignant neoplasm of rectum: Secondary | ICD-10-CM

## 2023-09-04 DIAGNOSIS — K625 Hemorrhage of anus and rectum: Secondary | ICD-10-CM

## 2023-09-04 DIAGNOSIS — R7303 Prediabetes: Secondary | ICD-10-CM

## 2023-09-04 DIAGNOSIS — Z1211 Encounter for screening for malignant neoplasm of colon: Secondary | ICD-10-CM | POA: Diagnosis not present

## 2023-09-04 DIAGNOSIS — I10 Essential (primary) hypertension: Secondary | ICD-10-CM

## 2023-09-04 LAB — POCT GLYCOSYLATED HEMOGLOBIN (HGB A1C): Hemoglobin A1C: 5.6 % (ref 4.0–5.6)

## 2023-09-04 MED ORDER — ZEPBOUND 2.5 MG/0.5ML ~~LOC~~ SOAJ: 2.5000 mg | SUBCUTANEOUS | 2 refills | Status: AC

## 2023-09-04 NOTE — Progress Notes (Signed)
 [facility] [address] [place], [place] [address]  Internal MEDICINE  Office Visit Note  Patient Name: Taylor Morris  [id number]  [id number]  Date of Service: 09/04/2023  Chief Complaint  Patient presents with   Annual Exam   Hypertension   Quality Metric Gaps    Colonoscopy   Referral    GI     HPI Pt is here for routine health maintenance examination -labs previously reviewed -mammogram done in April -pap UTD -Colonoscopy done last year but reccommended for annual repeat and will refer. Also having frequent trips to restroom daily, not diarrhea, but several BM daily. Occasional blood when wiping, but not in stool. Thinks possible hemorrhoids. Would like GI referral -BP stable -Still working on vaping, previously smoked 1ppd since age 35 up until last year when she switched to vaping -taking crestor  once per week -wellbutrin  in AM is doing well -taking cata Kor NAD+ supplement -will consider shingles vaccines  Current Medication: Outpatient Encounter Medications as of 09/04/2023  Medication Sig   amLODipine  (NORVASC ) 2.5 MG tablet Take 1 tablet (2.5 mg total) by mouth daily.   buPROPion  (WELLBUTRIN  XL) 150 MG 24 hr tablet Take 1 tablet (150 mg total) by mouth daily.   hydrochlorothiazide  (HYDRODIURIL ) 25 MG tablet Take 1 tablet (25 mg total) by mouth daily.   rosuvastatin  (CRESTOR ) 5 MG tablet Take 1 tablet (5 mg total) by mouth daily.   tirzepatide (ZEPBOUND) 2.5 MG/0.5ML Pen Inject 2.5 mg into the skin once a week.   [DISCONTINUED] Semaglutide -Weight Management 0.25 MG/0.5ML SOAJ Inject 0.25 mg into the skin once a week.   No facility-administered encounter medications on file as of 09/04/2023.    Surgical History: Past Surgical History:  Procedure Laterality Date   ABDOMINAL HYSTERECTOMY     BREAST BIOPSY Right 07/2014   surgery to remove abcess 3:00/ Dr Eluterio   BREAST DUCTAL SYSTEM EXCISION Right 07/22/2014   Procedure: right breast major duct  excision ;  Surgeon: Oneil Eluterio, MD;  Location: [facility] ORS;  Service: General;  Laterality: Right;   BREAST EXCISIONAL BIOPSY     CESAREAN SECTION     times 2   CHOLECYSTECTOMY     COLONOSCOPY WITH PROPOFOL  N/A 05/02/2022   Procedure: COLONOSCOPY WITH PROPOFOL ;  Surgeon: Unk Corinn Skiff, MD;  Location: [facility] ENDOSCOPY;  Service: Gastroenterology;  Laterality: N/A;   INCISION AND DRAINAGE BREAST ABSCESS Right 05/16   TUBAL LIGATION      Medical History: Past Medical History:  Diagnosis Date   Anxiety    Arthritis    Complication of anesthesia    H/O cesarean section    times 2   Hypertension    PONV (postoperative nausea and vomiting)     Family History: Family History  Problem Relation Age of Onset   Hypertension Mother    High Cholesterol Mother    Hypertension Father    High Cholesterol Father    Breast cancer Maternal Aunt 87   Cancer Maternal Aunt       Review of Systems  Constitutional:  Negative for chills, fatigue and unexpected weight change.  HENT:  Negative for congestion, rhinorrhea, sneezing and sore throat.   Eyes:  Negative for redness.  Respiratory:  Negative for chest tightness and shortness of breath.   Cardiovascular:  Negative for chest pain and palpitations.  Gastrointestinal:  Positive for anal bleeding. Negative for abdominal pain, blood in stool, constipation, diarrhea, nausea, rectal pain and vomiting.  Genitourinary:  Negative for dysuria  and frequency.  Musculoskeletal:  Negative for arthralgias, back pain, joint swelling and neck pain.  Skin:  Negative for rash.  Neurological: Negative.  Negative for tremors and numbness.  Hematological:  Negative for adenopathy. Does not bruise/bleed easily.  Psychiatric/Behavioral:  Negative for behavioral problems (Depression) and suicidal ideas. The patient is not nervous/anxious.      Vital Signs: BP 121/85   Pulse 77   Temp 98.3 F (36.8 C)   Resp 16   Ht 5' 2 (1.575 m)   Wt 197 lb (89.4 kg)    SpO2 100%   BMI 36.03 kg/m    Physical Exam Vitals and nursing note reviewed.  Constitutional:      General: She is not in acute distress.    Appearance: Normal appearance. She is well-developed. She is obese. She is not diaphoretic.  HENT:     Head: Normocephalic and atraumatic.     Mouth/Throat:     Pharynx: No posterior oropharyngeal erythema.   Eyes:     Extraocular Movements: Extraocular movements intact.     Pupils: Pupils are equal, round, and reactive to light.   Neck:     Thyroid: No thyromegaly.     Vascular: No JVD.     Trachea: No tracheal deviation.   Cardiovascular:     Rate and Rhythm: Normal rate and regular rhythm.     Heart sounds: Normal heart sounds. No murmur heard.    No friction rub. No gallop.  Pulmonary:     Effort: Pulmonary effort is normal.  Chest:     Chest wall: No tenderness.  Abdominal:     Palpations: Abdomen is soft.     Tenderness: There is no abdominal tenderness.   Musculoskeletal:        General: Normal range of motion.     Cervical back: Normal range of motion and neck supple.  Lymphadenopathy:     Cervical: No cervical adenopathy.   Skin:    General: Skin is warm and dry.   Neurological:     Mental Status: She is alert and oriented to person, place, and time.   Psychiatric:        Behavior: Behavior normal.        Thought Content: Thought content normal.        Judgment: Judgment normal.      LABS: Recent Results (from the past 2160 hours)  POCT HgB A1C     Status: None   Collection Time: 09/04/23 10:08 AM  Result Value Ref Range   Hemoglobin A1C 5.6 4.0 - 5.6 %   HbA1c POC (<> result, manual entry)     HbA1c, POC (prediabetic range)     HbA1c, POC (controlled diabetic range)          Assessment/Plan: 1. Encounter for general adult medical examination with abnormal findings (Primary) CPE performed, mammo and pap UTD. Due for colonoscopy and CT lung screening  2. Essential hypertension Stable,  continue current medication  3. Prediabetes - POCT HgB A1C is 5.6 which is improved from 5.7 and is now normal  4. Frequent bowel movements Will refer to GI - Ambulatory referral to Gastroenterology  5. Screening for colorectal cancer - Ambulatory referral to Gastroenterology  6. BRBPR (bright red blood per rectum) Occasional blood on TP, not in stool. Refer to GI - Ambulatory referral to Gastroenterology  7. Former smoker - CT CHEST LUNG CA SCREEN LOW DOSE W/O CM; Future  8. Obesity (BMI 30-39.9) Will try zepbound  -  tirzepatide (ZEPBOUND) 2.5 MG/0.5ML Pen; Inject 2.5 mg into the skin once a week.  Dispense: 2 mL; Refill: 2   General Counseling: Joelynn verbalizes understanding of the findings of todays visit and agrees with plan of treatment. I have discussed any further diagnostic evaluation that may be needed or ordered today. We also reviewed her medications today. she has been encouraged to call the office with any questions or concerns that should arise related to todays visit.    Counseling:    Orders Placed This Encounter  Procedures   CT CHEST LUNG CA SCREEN LOW DOSE W/O CM   Ambulatory referral to Gastroenterology   POCT HgB A1C    Meds ordered this encounter  Medications   tirzepatide (ZEPBOUND) 2.5 MG/0.5ML Pen    Sig: Inject 2.5 mg into the skin once a week.    Dispense:  2 mL    Refill:  2    This patient was seen by Tinnie Pro, PA-C in collaboration with Dr. Sigrid Bathe as a part of collaborative care agreement.  Total time spent:35 Minutes  Time spent includes review of chart, medications, test results, and follow up plan with the patient.     Sigrid CHRISTELLA Bathe, MD  Internal Medicine

## 2023-09-08 ENCOUNTER — Inpatient Hospital Stay: Admission: RE | Admit: 2023-09-08 | Source: Ambulatory Visit

## 2023-10-02 ENCOUNTER — Ambulatory Visit: Admitting: Physician Assistant

## 2023-10-19 ENCOUNTER — Telehealth: Payer: Self-pay

## 2023-10-19 NOTE — Telephone Encounter (Signed)
 Pt  called that she having pain in her left shoulder  and arm pain ,heart burn she think is muscle pull going on for few days and no chest pain advised her as per lauren go to urgent care

## 2023-10-20 ENCOUNTER — Other Ambulatory Visit: Payer: Self-pay

## 2023-10-20 ENCOUNTER — Emergency Department
Admission: EM | Admit: 2023-10-20 | Discharge: 2023-10-20 | Disposition: A | Discharge status: Home / Self Care | Attending: Emergency Medicine | Admitting: Emergency Medicine

## 2023-10-20 ENCOUNTER — Emergency Department

## 2023-10-20 DIAGNOSIS — Z87891 Personal history of nicotine dependence: Secondary | ICD-10-CM | POA: Diagnosis not present

## 2023-10-20 DIAGNOSIS — M25512 Pain in left shoulder: Secondary | ICD-10-CM | POA: Insufficient documentation

## 2023-10-20 DIAGNOSIS — M546 Pain in thoracic spine: Secondary | ICD-10-CM | POA: Diagnosis present

## 2023-10-20 DIAGNOSIS — E876 Hypokalemia: Secondary | ICD-10-CM | POA: Diagnosis not present

## 2023-10-20 DIAGNOSIS — I10 Essential (primary) hypertension: Secondary | ICD-10-CM | POA: Insufficient documentation

## 2023-10-20 LAB — CBC
HCT: 40.9 % (ref 36.0–46.0)
Hemoglobin: 13.7 g/dL (ref 12.0–15.0)
MCH: 31 pg (ref 26.0–34.0)
MCHC: 33.5 g/dL (ref 30.0–36.0)
MCV: 92.5 fL (ref 80.0–100.0)
Platelets: 243 K/uL (ref 150–400)
RBC: 4.42 MIL/uL (ref 3.87–5.11)
RDW: 13.7 % (ref 11.5–15.5)
WBC: 10.2 K/uL (ref 4.0–10.5)
nRBC: 0 % (ref 0.0–0.2)

## 2023-10-20 LAB — BASIC METABOLIC PANEL WITH GFR
Anion gap: 11 (ref 5–15)
BUN: 10 mg/dL (ref 6–20)
CO2: 27 mmol/L (ref 22–32)
Calcium: 9.2 mg/dL (ref 8.9–10.3)
Chloride: 100 mmol/L (ref 98–111)
Creatinine, Ser: 1.14 mg/dL — ABNORMAL HIGH (ref 0.44–1.00)
GFR, Estimated: 56 mL/min — ABNORMAL LOW (ref >60)
Glucose, Bld: 80 mg/dL (ref 70–99)
Potassium: 3 mmol/L — ABNORMAL LOW (ref 3.5–5.1)
Sodium: 138 mmol/L (ref 135–145)

## 2023-10-20 LAB — TROPONIN I (HIGH SENSITIVITY): Troponin I (High Sensitivity): 4 ng/L (ref <18)

## 2023-10-20 MED ORDER — ALBUTEROL SULFATE HFA 108 (90 BASE) MCG/ACT IN AERS: 2.0000 | INHALATION_SPRAY | Freq: Four times a day (QID) | RESPIRATORY_TRACT | 2 refills | Status: AC | PRN

## 2023-10-20 MED ORDER — METHOCARBAMOL 500 MG PO TABS
500.0000 mg | ORAL_TABLET | Freq: Once | ORAL | Status: AC
  Administered 2023-10-20: 500 mg via ORAL
  Filled 2023-10-20: qty 1

## 2023-10-20 MED ORDER — LIDOCAINE 5 % EX PTCH: 1.0000 | MEDICATED_PATCH | Freq: Two times a day (BID) | CUTANEOUS | 1 refills | Status: AC

## 2023-10-20 MED ORDER — ACETAMINOPHEN 500 MG PO TABS
1000.0000 mg | ORAL_TABLET | Freq: Once | ORAL | Status: AC
  Administered 2023-10-20: 1000 mg via ORAL
  Filled 2023-10-20: qty 2

## 2023-10-20 MED ORDER — METHOCARBAMOL 500 MG PO TABS: 500.0000 mg | ORAL_TABLET | Freq: Three times a day (TID) | ORAL | 0 refills | Status: AC | PRN

## 2023-10-20 MED ORDER — LIDOCAINE 5 % EX PTCH
2.0000 | MEDICATED_PATCH | CUTANEOUS | Status: DC
  Administered 2023-10-20: 2 via TRANSDERMAL
  Filled 2023-10-20: qty 2

## 2023-10-20 MED ORDER — POTASSIUM CHLORIDE CRYS ER 20 MEQ PO TBCR
40.0000 meq | EXTENDED_RELEASE_TABLET | Freq: Once | ORAL | Status: AC
  Administered 2023-10-20: 40 meq via ORAL
  Filled 2023-10-20: qty 2

## 2023-10-20 NOTE — ED Provider Notes (Signed)
 [facility] Provider Note    Event Date/Time   First MD Initiated Contact with Patient 10/20/23 1651     (approximate)   History   Shoulder Pain   HPI  Taylor Morris is a 58 y.o. female who presents to the ED for evaluation of Shoulder Pain   Review of PCP visit from June.  History of HTN, HLD, anxiety.  No cardiac history.  Patient presents with left-sided thoracic back pain over the past 1-2 days.  First noticed the symptoms while she was laying on her left side/back a couple days ago.  Has had intermittent pain since then without resolution.  Try to quit smoking, no shortness of breath or increased cough or increased sputum production.  No anterior chest pain.  No nausea or emesis.   Physical Exam   Triage Vital Signs: ED Triage Vitals  Encounter Vitals Group     BP 10/20/23 1512 (!) 157/84     Girls Systolic BP Percentile --      Girls Diastolic BP Percentile --      Boys Systolic BP Percentile --      Boys Diastolic BP Percentile --      Pulse Rate 10/20/23 1512 97     Resp 10/20/23 1512 18     Temp 10/20/23 1512 98.2 F (36.8 C)     Temp src --      SpO2 10/20/23 1512 98 %     Weight 10/20/23 1511 197 lb (89.4 kg)     Height 10/20/23 1511 5' 3 (1.6 m)     Head Circumference --      Peak Flow --      Pain Score 10/20/23 1510 7     Pain Loc --      Pain Education --      Exclude from Growth Chart --     Most recent vital signs: Vitals:   10/20/23 1512  BP: (!) 157/84  Pulse: 97  Resp: 18  Temp: 98.2 F (36.8 C)  SpO2: 98%    General: Awake, no distress.  CV:  Good peripheral perfusion.  Resp:  Normal effort.  Very faint and scattered mild end expiratory wheezes.  Good airflow throughout without tachypnea or distress. Abd:  No distention.  MSK:  No deformity noted.  Neuro:  No focal deficits appreciated. Other:  Tenderness to palpation to left-sided paraspinal thoracic back without overlying skin changes, rash or signs  of trauma.   ED Results / Procedures / Treatments   Labs (all labs ordered are listed, but only abnormal results are displayed) Labs Reviewed  BASIC METABOLIC PANEL WITH GFR - Abnormal; Notable for the following components:      Result Value   Potassium 3.0 (*)    Creatinine, Ser 1.14 (*)    GFR, Estimated 56 (*)    All other components within normal limits  CBC  TROPONIN I (HIGH SENSITIVITY)  TROPONIN I (HIGH SENSITIVITY)    EKG Sinus rhythm with a rate of 83 bpm.  Normal axis and intervals without evidence of acute ischemia.  RADIOLOGY CXR interpreted by me without evidence of acute cardiopulmonary pathology. Plain film of the left shoulder interpreted by me without evidence of fracture or dislocation  Official radiology report(s): DG Shoulder Left Result Date: 10/20/2023 CLINICAL DATA:  Acute left shoulder pain. EXAM: LEFT SHOULDER - 2+ VIEW COMPARISON:  None Available. FINDINGS: There is no evidence of fracture or dislocation. Mild degenerative changes seen involving left  acromioclavicular joint. Soft tissues are unremarkable. 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 No acute abnormality seen. Electronically Signed   By: Lynwood Landy Raddle M.D.   On: 10/20/2023 15:46   DG Chest 2 View Result Date: 10/20/2023 EXAM: 1 VIEW XRAY OF THE CHEST 10/20/2023 3:29 pm COMPARISON: 04/30/2018 CLINICAL HISTORY: FINDINGS: LUNGS AND PLEURA: No focal pulmonary opacity. No pulmonary edema. No pleural effusion. No pneumothorax. HEART AND MEDIASTINUM: No acute abnormality of the cardiac and mediastinal silhouettes. BONES AND SOFT TISSUES: No acute osseous abnormality. IMPRESSION: 1. No acute process. Electronically signed by: Limin Xu MD 10/20/2023 03:45 PM EDT RP Workstation: [id number]    PROCEDURES and INTERVENTIONS:  Procedures  Medications  lidocaine  (LIDODERM ) 5 % 2 patch (2 patches Transdermal Patch Applied 10/20/23 1829)  potassium chloride  SA (KLOR-CON  M)  CR tablet 40 mEq (40 mEq Oral Given 10/20/23 1828)  acetaminophen  (TYLENOL ) tablet 1,000 mg (1,000 mg Oral Given 10/20/23 1828)  methocarbamol  (ROBAXIN ) tablet 500 mg (500 mg Oral Given 10/20/23 1828)     IMPRESSION / MDM / ASSESSMENT AND PLAN / ED COURSE  I reviewed the triage vital signs and the nursing notes.  Differential diagnosis includes, but is not limited to, musculoskeletal pain, cellulitis, shingles, ACS, pneumothorax, pneumonia, COP exacerbation  {Patient presents with symptoms of an acute illness or injury that is potentially life-threatening.  Patient presents with atraumatic thoracic back pain of likely muscular etiology, with a benign workup and suitable for outpatient management.  Clear CXR.  Mild hypokalemia is replaced orally.  Normal troponin and CBC.  Reassuring imaging.  Improving symptoms with nonnarcotic multimodal analgesia.  She is requesting discharge and we discussed close return precautions.  Clinical Course as of 10/20/23 1927  [place] Oct 20, 2023  8154 I am called to the room and patient is requesting discharge.  She is standing in the room, reports she just wants to leave.  She has capacity.  We discussed multimodal nonnarcotic analgesia at home for her pain and ED return precautions. [DS]    Clinical Course User Index [DS] Claudene Rover, MD     FINAL CLINICAL IMPRESSION(S) / ED DIAGNOSES   Final diagnoses:  Acute left-sided thoracic back pain     Rx / DC Orders   ED Discharge Orders          Ordered    albuterol  (VENTOLIN  HFA) 108 (90 Base) MCG/ACT inhaler  Every 6 hours PRN        10/20/23 1847    methocarbamol  (ROBAXIN ) 500 MG tablet  Every 8 hours PRN        10/20/23 1847    lidocaine  (LIDODERM ) 5 %  Every 12 hours        10/20/23 1847             Note:  This document was prepared using Dragon voice recognition software and may include unintentional dictation errors.   Claudene Rover, MD 10/20/23 [phone]

## 2023-10-20 NOTE — Discharge Instructions (Addendum)
 Use Tylenol  for pain and fevers.  Up to 1000 mg per dose, up to 4 times per day.  Do not take more than 4000 mg of Tylenol /acetaminophen  within 24 hours..  Please use lidocaine  patches at your site of pain.  Apply 1 patch at a time, leave on for 12 hours, then remove for 12 hours.  12 hours on, 12 hours off.  Do not apply more than 1 patch at a time.  Use Robaxin  muscle relaxer as needed for more severe/breakthrough pain, up to 3 times per day. This medication can make some people sleepy, so do not use while driving, working or operating machinery   Use the albuterol  inhaler as needed for wheezing, coughing or shortness of breath  Return to the ED with any worsening symptoms despite these measures

## 2023-10-20 NOTE — ED Triage Notes (Signed)
 Pt comes with left shoulder pain and pressure that radiates down to her arm. Pt states this started late Tuesday night. Pt states the pain has increased. Pt went to UC and got shot but no relief.

## 2023-10-20 NOTE — ED Notes (Signed)
 See triage note  Presents with left shoulder pain  States she developed pain on Tuesday Denies any injury  Was seen at [facility] but states they only gave her  a shot. States pain is mainly to left shoulder and radiates into arm to elbow  Denies any SOB n/v cough of fever

## 2023-10-20 NOTE — ED Triage Notes (Signed)
 Pt to ED via POV from [facility]. Pt reports left shoulder pain w/ radiation to arm. Pt also reported CP and SOB

## 2023-10-24 ENCOUNTER — Other Ambulatory Visit: Payer: Self-pay | Admitting: Physician Assistant

## 2023-10-24 DIAGNOSIS — I10 Essential (primary) hypertension: Secondary | ICD-10-CM

## 2023-11-21 ENCOUNTER — Other Ambulatory Visit: Payer: Self-pay | Admitting: Physician Assistant

## 2023-11-21 DIAGNOSIS — R4586 Emotional lability: Secondary | ICD-10-CM

## 2024-02-26 ENCOUNTER — Other Ambulatory Visit: Payer: Self-pay | Admitting: Physician Assistant

## 2024-02-26 DIAGNOSIS — R4586 Emotional lability: Secondary | ICD-10-CM

## 2024-03-29 ENCOUNTER — Other Ambulatory Visit: Payer: Self-pay | Admitting: Physician Assistant

## 2024-03-29 DIAGNOSIS — R4586 Emotional lability: Secondary | ICD-10-CM

## 2024-09-05 ENCOUNTER — Encounter: Admitting: Physician Assistant
